# Patient Record
Sex: Female | Born: 1975 | Race: White | Hispanic: No | Marital: Single | Smoking: Current some day smoker
Health system: Southern US, Community
[De-identification: ages and names within clinical notes are randomized; demographics above are authoritative.]

## PROBLEM LIST (undated history)

## (undated) DIAGNOSIS — F319 Bipolar disorder, unspecified: Secondary | ICD-10-CM

## (undated) DIAGNOSIS — F1010 Alcohol abuse, uncomplicated: Secondary | ICD-10-CM

## (undated) DIAGNOSIS — F200 Paranoid schizophrenia: Secondary | ICD-10-CM

---

## 1998-12-05 ENCOUNTER — Emergency Department (HOSPITAL_COMMUNITY): Admission: EM | Admit: 1998-12-05 | Discharge: 1998-12-05 | Payer: Self-pay

## 2001-11-01 ENCOUNTER — Emergency Department (HOSPITAL_COMMUNITY): Admission: EM | Admit: 2001-11-01 | Discharge: 2001-11-01 | Payer: Self-pay

## 2001-12-30 ENCOUNTER — Emergency Department (HOSPITAL_COMMUNITY): Admission: EM | Admit: 2001-12-30 | Discharge: 2001-12-30 | Payer: Self-pay | Admitting: Emergency Medicine

## 2002-04-12 ENCOUNTER — Emergency Department (HOSPITAL_COMMUNITY): Admission: EM | Admit: 2002-04-12 | Discharge: 2002-04-12 | Payer: Self-pay | Admitting: Emergency Medicine

## 2003-01-30 ENCOUNTER — Inpatient Hospital Stay (HOSPITAL_COMMUNITY): Admission: EM | Admit: 2003-01-30 | Discharge: 2003-02-09 | Payer: Self-pay | Admitting: Psychiatry

## 2003-05-20 ENCOUNTER — Emergency Department (HOSPITAL_COMMUNITY): Admission: AD | Admit: 2003-05-20 | Discharge: 2003-05-20 | Payer: Self-pay | Admitting: Emergency Medicine

## 2005-06-25 ENCOUNTER — Emergency Department (HOSPITAL_COMMUNITY): Admission: EM | Admit: 2005-06-25 | Discharge: 2005-06-26 | Payer: Self-pay | Admitting: *Deleted

## 2006-04-10 ENCOUNTER — Emergency Department (HOSPITAL_COMMUNITY): Admission: EM | Admit: 2006-04-10 | Discharge: 2006-04-10 | Payer: Self-pay | Admitting: Emergency Medicine

## 2006-09-21 ENCOUNTER — Emergency Department (HOSPITAL_COMMUNITY): Admission: EM | Admit: 2006-09-21 | Discharge: 2006-09-21 | Payer: Self-pay | Admitting: Emergency Medicine

## 2007-06-25 ENCOUNTER — Ambulatory Visit: Payer: Self-pay | Admitting: Psychiatry

## 2007-06-25 ENCOUNTER — Other Ambulatory Visit: Payer: Self-pay | Admitting: Emergency Medicine

## 2007-06-25 ENCOUNTER — Inpatient Hospital Stay (HOSPITAL_COMMUNITY): Admission: AD | Admit: 2007-06-25 | Discharge: 2007-07-16 | Payer: Self-pay | Admitting: Psychiatry

## 2008-01-02 ENCOUNTER — Emergency Department (HOSPITAL_COMMUNITY): Admission: EM | Admit: 2008-01-02 | Discharge: 2008-01-03 | Payer: Self-pay | Admitting: Emergency Medicine

## 2008-01-03 ENCOUNTER — Emergency Department (HOSPITAL_COMMUNITY): Admission: EM | Admit: 2008-01-03 | Discharge: 2008-01-03 | Payer: Self-pay | Admitting: Emergency Medicine

## 2008-06-20 ENCOUNTER — Ambulatory Visit (HOSPITAL_BASED_OUTPATIENT_CLINIC_OR_DEPARTMENT_OTHER): Admission: RE | Admit: 2008-06-20 | Discharge: 2008-06-20 | Payer: Self-pay

## 2008-06-26 ENCOUNTER — Ambulatory Visit: Payer: Self-pay | Admitting: Internal Medicine

## 2008-12-08 ENCOUNTER — Emergency Department (HOSPITAL_COMMUNITY): Admission: EM | Admit: 2008-12-08 | Discharge: 2008-12-08 | Payer: Self-pay | Admitting: Emergency Medicine

## 2009-03-11 ENCOUNTER — Ambulatory Visit: Payer: Self-pay | Admitting: Family Medicine

## 2009-03-11 ENCOUNTER — Encounter: Payer: Self-pay | Admitting: Family Medicine

## 2009-03-11 DIAGNOSIS — E669 Obesity, unspecified: Secondary | ICD-10-CM | POA: Insufficient documentation

## 2009-03-11 DIAGNOSIS — F17200 Nicotine dependence, unspecified, uncomplicated: Secondary | ICD-10-CM | POA: Insufficient documentation

## 2009-03-11 DIAGNOSIS — G4733 Obstructive sleep apnea (adult) (pediatric): Secondary | ICD-10-CM

## 2009-03-11 DIAGNOSIS — F259 Schizoaffective disorder, unspecified: Secondary | ICD-10-CM | POA: Insufficient documentation

## 2009-03-11 DIAGNOSIS — N912 Amenorrhea, unspecified: Secondary | ICD-10-CM | POA: Insufficient documentation

## 2009-03-11 DIAGNOSIS — F250 Schizoaffective disorder, bipolar type: Secondary | ICD-10-CM | POA: Insufficient documentation

## 2009-03-11 LAB — CONVERTED CEMR LAB
AST: 20 units/L (ref 0–37)
Albumin: 3.8 g/dL (ref 3.5–5.2)
Alkaline Phosphatase: 96 units/L (ref 39–117)
BUN: 9 mg/dL (ref 6–23)
Creatinine, Ser: 0.81 mg/dL (ref 0.40–1.20)
Glucose, Bld: 81 mg/dL (ref 70–99)
HDL: 44 mg/dL (ref >39)
LDL Cholesterol: 159 mg/dL — ABNORMAL HIGH (ref 0–99)
Potassium: 4.4 meq/L (ref 3.5–5.3)
Total Bilirubin: 0.3 mg/dL (ref 0.3–1.2)
Total CHOL/HDL Ratio: 5.3
Triglycerides: 152 mg/dL — ABNORMAL HIGH (ref <150)

## 2009-03-21 ENCOUNTER — Encounter: Payer: Self-pay | Admitting: Family Medicine

## 2009-03-21 ENCOUNTER — Telehealth: Payer: Self-pay | Admitting: Family Medicine

## 2009-03-29 ENCOUNTER — Ambulatory Visit (HOSPITAL_BASED_OUTPATIENT_CLINIC_OR_DEPARTMENT_OTHER): Admission: RE | Admit: 2009-03-29 | Discharge: 2009-03-29 | Payer: Self-pay | Admitting: Family Medicine

## 2009-03-29 ENCOUNTER — Encounter: Payer: Self-pay | Admitting: Family Medicine

## 2009-04-05 ENCOUNTER — Encounter: Payer: Self-pay | Admitting: Family Medicine

## 2009-04-05 ENCOUNTER — Other Ambulatory Visit: Admission: RE | Admit: 2009-04-05 | Discharge: 2009-04-05 | Payer: Self-pay | Admitting: Family Medicine

## 2009-04-05 ENCOUNTER — Ambulatory Visit: Payer: Self-pay | Admitting: Family Medicine

## 2009-04-05 LAB — CONVERTED CEMR LAB
Beta hcg, urine, semiquantitative: NEGATIVE
Pap Smear: NORMAL

## 2009-04-08 ENCOUNTER — Encounter: Payer: Self-pay | Admitting: Family Medicine

## 2009-04-15 ENCOUNTER — Ambulatory Visit: Payer: Self-pay | Admitting: Family Medicine

## 2009-04-15 ENCOUNTER — Encounter: Payer: Self-pay | Admitting: Family Medicine

## 2009-06-03 ENCOUNTER — Emergency Department (HOSPITAL_COMMUNITY): Admission: EM | Admit: 2009-06-03 | Discharge: 2009-06-03 | Payer: Self-pay | Admitting: Family Medicine

## 2009-07-14 ENCOUNTER — Emergency Department (HOSPITAL_COMMUNITY): Admission: EM | Admit: 2009-07-14 | Discharge: 2009-07-14 | Payer: Self-pay | Admitting: Emergency Medicine

## 2009-12-14 ENCOUNTER — Encounter: Payer: Self-pay | Admitting: Family Medicine

## 2009-12-14 ENCOUNTER — Ambulatory Visit: Payer: Self-pay | Admitting: Family Medicine

## 2009-12-14 DIAGNOSIS — F1010 Alcohol abuse, uncomplicated: Secondary | ICD-10-CM | POA: Insufficient documentation

## 2009-12-14 DIAGNOSIS — M159 Polyosteoarthritis, unspecified: Secondary | ICD-10-CM | POA: Insufficient documentation

## 2009-12-15 LAB — CONVERTED CEMR LAB
ALT: 25 units/L (ref 0–35)
AST: 18 units/L (ref 0–37)
Albumin: 4.1 g/dL (ref 3.5–5.2)
Alkaline Phosphatase: 110 units/L (ref 39–117)
BUN: 11 mg/dL (ref 6–23)
Chloride: 101 meq/L (ref 96–112)
Creatinine, Ser: 0.79 mg/dL (ref 0.40–1.20)
Hemoglobin: 16.3 g/dL — ABNORMAL HIGH (ref 12.0–15.0)
MCHC: 32.6 g/dL (ref 30.0–36.0)
Platelets: 251 10*3/uL (ref 150–400)
Potassium: 4.6 meq/L (ref 3.5–5.3)
RDW: 14.6 % (ref 11.5–15.5)

## 2010-03-01 ENCOUNTER — Ambulatory Visit: Payer: Self-pay | Admitting: Family Medicine

## 2010-03-09 ENCOUNTER — Telehealth: Payer: Self-pay | Admitting: Pulmonary Disease

## 2010-05-24 ENCOUNTER — Ambulatory Visit: Payer: Self-pay | Admitting: Family Medicine

## 2010-07-26 ENCOUNTER — Emergency Department (HOSPITAL_COMMUNITY): Admission: EM | Admit: 2010-07-26 | Discharge: 2010-07-26 | Payer: Self-pay | Admitting: Family Medicine

## 2010-09-06 ENCOUNTER — Ambulatory Visit (INDEPENDENT_AMBULATORY_CARE_PROVIDER_SITE_OTHER): Payer: Self-pay
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
## 2010-11-02 NOTE — Progress Notes (Signed)
Summary: nos appt  Phone Note Call from Patient   Caller: [email or web address]  Call For: Tonya Gill Summary of Call: LMTCB x2 to rsc nos from 6/8. Initial call taken by: Darletta Moll,  March 09, 2010 2:59 PM

## 2010-11-02 NOTE — Assessment & Plan Note (Signed)
Summary: depo,df  Nurse Visit   Allergies: No Known Drug Allergies  Medication Administration  Injection # 1:    Medication: Depo-Provera 150mg     Diagnosis: CONTRACEPTIVE MANAGEMENT (ICD-V25.09)    Route: IM    Site: RUOQ gluteus    Exp Date: 11/2012    Lot #: [id number]    Mfr: [email or web address]    Comments: next depo due Nov 9 thru Aug 23, 2010    Patient tolerated injection without complications    Given by: Theresia Lo RN (May 24, 2010 1:44 PM)  Orders Added: 1)  Depo-Provera 150mg  [J1055] 2)  Admin of Injection (IM/SQ) [04540]   Medication Administration  Injection # 1:    Medication: Depo-Provera 150mg     Diagnosis: CONTRACEPTIVE MANAGEMENT (ICD-V25.09)    Route: IM    Site: RUOQ gluteus    Exp Date: 11/2012    Lot #: [id number]    Mfr: greenstone    Comments: next depo due Nov 9 thru Aug 23, 2010    Patient tolerated injection without complications    Given by: Theresia Lo RN (May 24, 2010 1:44 PM)  Orders Added: 1)  Depo-Provera 150mg  [J1055] 2)  Admin of Injection (IM/SQ) [47829]

## 2010-11-02 NOTE — Assessment & Plan Note (Signed)
Summary: depo,df  Nurse Visit   Allergies: No Known Drug Allergies Laboratory Results   Urine Tests  Date/Time Received: September 06, 2010 1:47 PM Date/Time Reported: September 06, 2010 1:59 PM     Urine HCG: negative Comments: ...............test performed by......Marland KitchenBonnie A. [place], MLS (ASCP)cm     Medication Administration  Injection # 1:    Medication: Depo-Provera 150mg     Diagnosis: CONTRACEPTIVE MANAGEMENT (ICD-V25.09)    Route: IM    Site: RUOQ gluteus    Exp Date: 01/2013    Lot #: [id number]    Mfr: greenstone    Comments:  next depo due Feb 22 thru December 07, 2010    Patient tolerated injection without complications    Given by: Theresia Lo RN (September 06, 2010 2:25 PM)  Orders Added: 1)  U Preg-[facility] [81025] 2)  Depo-Provera 150mg  [J1055] 3)  Admin of Injection (IM/SQ) [04540]    Medication Administration  Injection # 1:    Medication: Depo-Provera 150mg     Diagnosis: CONTRACEPTIVE MANAGEMENT (ICD-V25.09)    Route: IM    Site: RUOQ gluteus    Exp Date: 01/2013    Lot #: [id number]    Mfr: greenstone    Comments:  next depo due Feb 22 thru December 07, 2010    Patient tolerated injection without complications    Given by: Theresia Lo RN (September 06, 2010 2:25 PM)  Orders Added: 1)  U Preg-[facility] [81025] 2)  Depo-Provera 150mg  [J1055] 3)  Admin of Injection (IM/SQ) [47829]

## 2010-11-02 NOTE — Assessment & Plan Note (Signed)
Summary: depo,df  Nurse Visit   Allergies: No Known Drug Allergies  Medication Administration  Injection # 1:    Medication: Depo-Provera 150mg     Diagnosis: CONTRACEPTIVE MANAGEMENT (ICD-V25.09)    Route: IM    Site: LUOQ gluteus    Exp Date: 12/2011    Lot #: [id number]    Mfr: Pharmacia    Comments: next depo due August 17 thru May 31, 2010    Patient tolerated injection without complications    Given by: Theresia Lo RN (March 01, 2010 1:52 PM)  Orders Added: 1)  Depo-Provera 150mg  [J1055] 2)  Admin of Injection (IM/SQ) [04540]   Medication Administration  Injection # 1:    Medication: Depo-Provera 150mg     Diagnosis: CONTRACEPTIVE MANAGEMENT (ICD-V25.09)    Route: IM    Site: LUOQ gluteus    Exp Date: 12/2011    Lot #: [id number]    Mfr: Pharmacia    Comments: next depo due August 17 thru May 31, 2010    Patient tolerated injection without complications    Given by: Theresia Lo RN (March 01, 2010 1:52 PM)  Orders Added: 1)  Depo-Provera 150mg  [J1055] 2)  Admin of Injection (IM/SQ) [47829]

## 2010-11-14 ENCOUNTER — Encounter: Payer: Self-pay | Admitting: Family Medicine

## 2010-11-14 ENCOUNTER — Ambulatory Visit (INDEPENDENT_AMBULATORY_CARE_PROVIDER_SITE_OTHER): Payer: Medicaid Other | Admitting: Family Medicine

## 2010-11-14 DIAGNOSIS — E782 Mixed hyperlipidemia: Secondary | ICD-10-CM

## 2010-11-14 DIAGNOSIS — E669 Obesity, unspecified: Secondary | ICD-10-CM

## 2010-11-14 DIAGNOSIS — G4733 Obstructive sleep apnea (adult) (pediatric): Secondary | ICD-10-CM

## 2010-11-14 DIAGNOSIS — H0260 Xanthelasma of unspecified eye, unspecified eyelid: Secondary | ICD-10-CM

## 2010-11-14 NOTE — Assessment & Plan Note (Signed)
Refer to derm.  Pt would like these removed.

## 2010-11-14 NOTE — Assessment & Plan Note (Signed)
Stable.  Pt interested in weight loss.  rec diet and exercise changes.  Check LDL today.

## 2010-11-14 NOTE — Patient Instructions (Addendum)
Please go to the lab for a cholesterol check I will send you a letter with the results I have put in an order for your CPAP and for your dermatology referral Stop drinking any beer or soda Add frozen or low sodium vegetables to your diet Eat three times a day Come back in 2-3 months to see how you are doing

## 2010-11-14 NOTE — Progress Notes (Signed)
  Subjective:    Patient ID: Tonya Gill, female    DOB: 14-Jul-1976, 35 y.o.   MRN: [id number]  HPI  Sleep apnea-  Got a sleep study 2 years ago but need got CPAP.  Wants to get one now.    Obesity- only eats 1 meal a day.  Only has 100 dollars to eat/month.  Eats ramen/ bologna/bread/cereal.  Limited vegetables.  Is homeless.  Rents room from relative.  Review of Systems     Objective:   Physical Exam  Constitutional: She appears well-developed and well-nourished.  Eyes:    Cardiovascular: Normal rate and regular rhythm.   Pulmonary/Chest: Effort normal and breath sounds normal.          Assessment & Plan:

## 2010-12-29 NOTE — Progress Notes (Signed)
  Subjective:    Patient ID: Tonya Gill, female    DOB: [birth year], 35 y.o.   MRN: [id number]  HPI reinput orders   Review of Systems     Objective:   Physical Exam        Assessment & Plan:

## 2011-01-01 ENCOUNTER — Encounter: Payer: Self-pay | Admitting: *Deleted

## 2011-01-03 ENCOUNTER — Other Ambulatory Visit: Payer: Self-pay | Admitting: Family Medicine

## 2011-01-03 DIAGNOSIS — G4733 Obstructive sleep apnea (adult) (pediatric): Secondary | ICD-10-CM

## 2011-01-11 LAB — POCT I-STAT, CHEM 8
BUN: 3 mg/dL — ABNORMAL LOW (ref 6–23)
Calcium, Ion: 1.09 mmol/L — ABNORMAL LOW (ref 1.12–1.32)
Chloride: 98 mEq/L (ref 96–112)
Creatinine, Ser: 0.9 mg/dL (ref 0.4–1.2)
Glucose, Bld: 116 mg/dL — ABNORMAL HIGH (ref 70–99)

## 2011-01-11 LAB — URINALYSIS, ROUTINE W REFLEX MICROSCOPIC
Protein, ur: NEGATIVE mg/dL
Specific Gravity, Urine: 1.001 — ABNORMAL LOW (ref 1.005–1.030)
Urobilinogen, UA: 0.2 mg/dL (ref 0.0–1.0)

## 2011-01-11 LAB — GLUCOSE, CAPILLARY: Glucose-Capillary: 131 mg/dL — ABNORMAL HIGH (ref 70–99)

## 2011-01-11 LAB — POCT PREGNANCY, URINE: Preg Test, Ur: NEGATIVE

## 2011-02-12 ENCOUNTER — Ambulatory Visit (HOSPITAL_BASED_OUTPATIENT_CLINIC_OR_DEPARTMENT_OTHER): Payer: Medicare Other | Attending: Family Medicine

## 2011-02-12 DIAGNOSIS — G4733 Obstructive sleep apnea (adult) (pediatric): Secondary | ICD-10-CM | POA: Insufficient documentation

## 2011-02-13 NOTE — Procedures (Signed)
NAME:  Tonya Gill, Tonya Gill               ACCOUNT NO.:  [id number]   MEDICAL RECORD NO.:  [id number]          PATIENT TYPE:  OUT   LOCATION:  SLEEP CENTER                 FACILITY:  [facility]   PHYSICIAN:  Clinton D. Maple Hudson, MD, FCCP, FACPDATE OF BIRTH:  1974-01-[birth year]   DATE OF STUDY:  03/29/2009                            NOCTURNAL POLYSOMNOGRAM   REFERRING PHYSICIAN:   REFERRING PHYSICIAN:  Romero Belling, MD   INDICATIONS FOR STUDY:  Hypersomnia with sleep apnea.   EPWORTH SLEEPINESS SCORE:  Epworth sleepiness score 12/24.  BMI 41.3.  Weight 256 pounds.  Height 66 inches.  Neck 17 inches.   HOME MEDICATIONS:  Charted and reviewed.   A baseline diagnostic NPSG on June 20, 2008 recorded an AHI of 12.8  per hour.  CPAP titration is requested.   SLEEP ARCHITECTURE:  Total sleep time 377 minutes with sleep efficiency  80.4%.  Stage I was 8.5%, stage II 56.3%, stage III 20%, REM 15.2% of  total sleep time.  Sleep latency 51 minutes, REM latency 89 minutes,  awake after sleep onset 44 minutes, arousal index 32.38.   BEDTIME MEDICATION:  Lorazepam, benztropine, and fluphenazine.   RESPIRATORY DATA:  CPAP titration protocol.  CPAP was titrated to 16  CWP, AHI 0 per hour.  She wore a medium ResMed Quattro full-face mask  with heated humidifier.   OXYGEN DATA:  Snoring was largely prevented by CPAP and mean oxygen  saturation with CPAP and room air was 91.9%.   CARDIAC DATA:  Normal sinus rhythm.   MOVEMENT/PARASOMNIA:  No significant movement disturbance.  Bathroom x1.   IMPRESSION/RECOMMENDATIONS:  1. Successful continuous positive airway pressure titration to 16 CWP,      AHI 0 per hour.  She wore a medium      ResMed Quattro full-face mask with heated humidifier.  2. Baseline diagnostic nocturnal polysomnogram on June 20, 2008,      recorded an AHI of 12.8 per hour.      Clinton D. Maple Hudson, MD, [facility], FACP  Diplomate, Biomedical engineer of Sleep Medicine  Electronically  Signed     CDY/MEDQ  D:  04/16/2009 15:03:57  T:  04/16/2009 23:40:16  Job:  [id number]
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
## 2011-02-13 NOTE — Procedures (Signed)
NAME:  Tonya Gill, Tonya Gill NO.:  [id number]   MEDICAL RECORD NO.:  [id number]          PATIENT TYPE:  OUT   LOCATION:  SLEEP CENTER                 FACILITY:  [facility]   PHYSICIAN:  Clinton D. Maple Hudson, MD, FCCP, FACPDATE OF BIRTH:  04/16/74   DATE OF STUDY:  06/20/2008                            NOCTURNAL POLYSOMNOGRAM   REFERRING PHYSICIAN:   REFERRING PHYSICIAN:  Dr. Holli Humbles.   INDICATION FOR STUDY:  Hypersomnia with sleep apnea.   EPWORTH SLEEPINESS SCORE:  11/25, BMI 41.3, weight 256 pounds, height 66  inches, neck 14 inches.   HOME MEDICATIONS:  Charted and reviewed.   SLEEP ARCHITECTURE:  Total sleep time 364 minutes with sleep efficiency  80.8%.  Stage 1 was 5.2%, stage 2 was 76.3%, stage 3 absent, REM 18.5%  of total sleep time.  Sleep latency 30 minutes, REM latency 243 minutes,  awake after sleep onset 55 minutes, arousal index 4.4.  Home medications  were taken at 7 p.m. before arrival at Sleep Center.   RESPIRATORY DATA:  Diagnostic NPSG protocol as ordered.  Apnea/hypopnea  index (AHI) 12.8 per hour.  A total of 78 events were scored including 3  obstructive apneas and 75 hypopneas.  Some events were recorded while  non-supine, but most events were while sleeping supine.  REM AHI 23.1  per hour.  CPAP titration was not ordered.   OXYGEN DATA:  Moderate snoring with oxygen desaturation to a nadir of  84%.  Mean oxygen saturation through the study was 91.5% on room air.  A  total of 2.9 minutes was recorded with oxygen saturation less than 88%  on room air.   CARDIAC DATA:  Normal sinus rhythm.   MOVEMENT/PARASOMNIA:  No significant movement disturbance.  Bathroom x1.   IMPRESSION/RECOMMENDATION:  1. Sleep architecture was not remarkable for Sleep Center environment.  2. Mild obstructive sleep apnea/hypopnea syndrome, apnea/hypopnea      index 12.8 per hour.  Events were more common while supine, but      noted in all sleep positions.   Moderate snoring with oxygen      desaturation to a nadir of  84%.  3. Scores in this range may respond to conservative therapy including      weight loss, treatment for upper airway congestion or obstruction,      and encouragement to sleep off flat of back.  Continuous positive      airway pressure      (CPAP) would also be a therapeutic option.  If appropriate,      consider return for continuous positive airway pressure titration      study or alternative management as appropriate.      Clinton D. Maple Hudson, MD, [facility], FACP  Diplomate, Biomedical engineer of Sleep Medicine  Electronically Signed     CDY/MEDQ  D:  06/26/2008 11:23:54  T:  06/27/2008 16:51:05  Job:  [id number]

## 2011-02-13 NOTE — Discharge Summary (Signed)
NAMEKASHONDA, SARKISYAN NO.:  [id number]   MEDICAL RECORD NO.:  [id number]          PATIENT TYPE:  IPS   LOCATION:  0401                          [facility]ACILITY:  [facility]   PHYSICIAN:  Anselm Jungling, MD  DATE OF BIRTH:  1976-04-13   DATE OF ADMISSION:  06/25/2007  DATE OF DISCHARGE:                               DISCHARGE SUMMARY   ADDENDUM TO DISCHARGE SUMMARY   Please refer to the discharge summary that I dictate on Ms. Kenan on  07/07/2007.   In summary, the patient was admitted to our facility on 06/25/2007.  On  07/07/2007,  it was felt that the patient would be transferred to [facility] for longer-term inpatient psychiatric care.  At that  time, the previous discharge summary was dictated in preparation for  that transfer.  Unfortunately, due to unavailability of space at [facility] at that time, the patient could not be transferred.  Over the following 2 days, the patient showed what appeared to be the  beginnings of an improving trend, with markedly less irritability, more  ability to engage in some degree of treatment and discharge planning.  A  plan was enacted to explore the possibility of the patient going to the  [facility] located in [place], a 90 day program  geared towards dual diagnosis treatment.   We began to pursue the possibility of the patient going to [facility],  but in the meantime, she appeared to lose the gains previously mentioned  that appeared to signal an improving trend.  She had been on a regimen  of Risperdal 2 mg q.h.s.  At this time, Haldol 10 mg q.h.s. was added to  her regimen.   Since then, it has become apparent that the patient is not really  capable of participating in further treatment planning and discharge  planning with [place].  She has been unable to complete and fulfill various  tasks that would pertain to her going to [facility], including  participating in the telephone  interview with their intake and  assessment department.  The patient has shown inability and  unwillingness to do so.   In addition, the patient has begun to refuse doses of anti-psychotic  medication intermittently.   All of the above being the case, we have determined that the patient  will not be appropriate for a program such as [facility], and will  indeed need to go to a longterm psychiatric facility such as the state  operated facility in [place], [place].  In preparation for this,  this current document is being dictated in preparation for her transfer  there.   At present, her medication regimen consists of Risperdal 2 mg q.h.s.,  Haldol 10 mg q.h.s., and multivitamin 1 tablet daily, and thiamine 100  mg daily.  She also has p.r.n. medications ordered including Tylenol,  Maalox, Milk of Magnesia, Dulcolax, Cepacol.   The undersigned and case manager, [employer], will continue to work with  the patient and the intake department at [facility] regarding  the patient's transfer there as soon as  it can be effected.   It should be noted that [facility] assessment department has requested  additional laboratory, specifically a CBC.  This was requested because  of a slightly elevated WBC count in the patient's initial lab work.  The  CBC was  repeated on approximately 07/10/2007, and WBCs were 9,300, and the CBC  profile was otherwise within normal limits.  There have been no medical  problems or new medical issues since the initial discharge summary was  dictated on 07/07/2007.      Anselm Jungling, MD  Electronically Signed     SPB/MEDQ  D:  07/15/2007  T:  07/15/2007  Job:  [id number]
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
## 2011-02-16 NOTE — Discharge Summary (Signed)
NAME:  Tonya Gill, Tonya Gill                         ACCOUNT NO.:  [id number]   MEDICAL RECORD NO.:  [id number]                   PATIENT TYPE:  IPS   LOCATION:  0401                                 FACILITY:  [facility]   PHYSICIAN:  Geoffery Lyons, M.D.                   DATE OF BIRTH:  14-Feb-1976   DATE OF ADMISSION:  01/30/2003  DATE OF DISCHARGE:  02/09/2003                                 DISCHARGE SUMMARY   CHIEF COMPLAINT AND PRESENT ILLNESS:  This was a 35 year old female former  PACT Team patient diagnosed with schizoaffective disorder versus  schizophrenia.  She was being treated with medication or a therapist for the  last one year.  She had been becoming more depressed, staying in bed up to  16 hours per day, and abusing alcohol, some issues with marijuana and  cocaine but stepfather did not think that she had been using it.  Upon  admission, she was irritable.  She claimed she was not real, she was just a  voice, You cannot take my blood pressure because I have no heart; refused  treatment pertaining to the admission process.   PAST PSYCHIATRIC HISTORY:  She has been inpatient and outpatient, although  she is very guarded about the information.   SUBSTANCE ABUSE HISTORY:  She had been using alcohol to some degree of late.  She has a past history of marijuana and cocaine abuse.   PHYSICAL EXAMINATION:  Physical examination reveals was performed, failed to  show any acute findings.   MENTAL STATUS EXAM:  Mental status exam revealed a casually dressed female,  some psychomotor retardation, not answering any questions, somewhat  lethargic, some evidence of some delusional ideas.  There were some  hallucinations.  Cognitive: Cognition seemed to be well preserved.   ADMISSION DIAGNOSES:   AXIS I:  1. Schizoaffective disorder.  2. Alcohol abuse.   AXIS II:  No diagnosis.   AXIS III:  Obesity.   AXIS IV:  Moderate.   AXIS V:  Global assessment of functioning upon admission  20-25, highest  global assessment of functioning in the last year 55-60.   HOSPITAL COURSE:  She was admitted and started in intensive individual and  group psychotherapy.  She was started on Seroquel, which was discontinued,  and she was switched to Geodon 80 mg twice a day.  She was started on Paxil  that was increased up to 25 mg per day.  She was initially very reserved,  very guarded, evidencing acute delusional ideas, although denied  hallucinations.  She would not admit to anything being wrong with her mind.  She denied symptoms.  She claimed that she took her medications.  Mood was  anxious, affect was constricted.  There were positive delusional ideas as  well as hallucinations.  As we increased the Geodon, she seemed to start  feeling better, was more focused, rational.  There  was less response to  delusional ideas.  She did experience evidence of fluctuations.  After  seemingly improved she then showed to be somewhat guarded, reserved,  changing the story a couple of times of what her accommodations were in  terms of where she was going to go.  We were not able in touch with the  stepfather or the roommate.  She was wanting to be discharged and we made  several attempts to get the plan going.  It felt that she was basically  baseline but the main concern was discharge as she was wanting to be  discharged and the arrangements were slow coming.  On May 11, she was in  full contact with reality, mood was euthymic, affect was brighter.  She knew  that we had contacted the stepfather and that he was going to pick her up.  The roommate-stepfather- was content with her discharge as long as she would  stay on her medications and she agreed to do that.   DISCHARGE DIAGNOSES:   AXIS I:  Schizoaffective disorder.   AXIS II:  No diagnosis.   AXIS III:  No diagnosis.   AXIS IV:  Moderate.   AXIS V:  Global assessment of functioning upon discharge 50.   DISCHARGE MEDICATIONS:  1.  Geodon 80 mg twice a day.  2. Paxil CR 25 mg per day.   FOLLOW UP:  She was to follow up at [facility].                                               Geoffery Lyons, M.D.    IL/MEDQ  D:  03/10/2003  T:  03/11/2003  Job:  [id number]

## 2011-02-17 DIAGNOSIS — G4733 Obstructive sleep apnea (adult) (pediatric): Secondary | ICD-10-CM

## 2011-02-17 NOTE — Procedures (Signed)
NAME:  Tonya Gill, Tonya Gill NO.:  [id number]  MEDICAL RECORD NO.:  [id number]          PATIENT TYPE:  OUT  LOCATION:  SLEEP CENTER                 FACILITY:  [facility]  PHYSICIAN:  Clinton D. Maple Hudson, MD, FCCP, FACPDATE OF BIRTH:  02/23/1974  DATE OF STUDY:  02/12/2011                           NOCTURNAL POLYSOMNOGRAM  REFERRING PHYSICIAN:  RACHEL SPIEGEL  INDICATION FOR STUDY:  Hypersomnia with sleep apnea.  EPWORTH SLEEPINESS SCORE:  16/24.  BMI 45.2.  Weight 280 pounds, height 66 inches.  Neck 17 inches.  MEDICATIONS:  Home medications were charted and reviewed..  A baseline diagnostic NPSG on June 20, 2008, had recorded an AHI of 12.8 per hour.  CPAP titration on March 29, 2009, titrated to the pressure of 16 cwp for an AHI of 0 per hour.  Weight was 256 pounds.  SLEEP ARCHITECTURE:  Split study protocol.  During the diagnostic phase, total sleep time 124 minutes with sleep efficiency 62.6%.  Stage I was 23.4%, stage II 74.6%, stage III 2%, REM absent.  Sleep latency 14.5 minutes, awake after sleep onset 56.5 minutes, arousal index 37.7. Bedtime medication:  Ativan, Cogentin, Prolixin, and Vistaril.  RESPIRATORY DATA:  Split study protocol.  Apnea/hypopnea index (AHI) 26.2 per hour.  A total of 53 events was scored including 6 obstructive apneas, 1 central apnea, 46 hypopneas.  Events were associated with non supine sleep.  CPAP was then titrated to 14 mL cwp, AHI 2.1 per hour. She wore a medium ResMed Mirage Quattro full-face mask with heated humidifier and C-flex setting of 3.  OXYGEN DATA:  Before CPAP control snoring was extremely loud with oxygen desaturation to a nadir of 85% on room air.  With CPAP titration, mean oxygen saturation held 91.8% on room air and snoring was prevented.  CARDIAC DATA:  Normal sinus rhythm.  MOVEMENT/PARASOMNIA:  No significant movement disturbance.  Bathroom times one.  The patient was unable to sleep supine because of a  back injury.  IMPRESSION/RECOMMENDATIONS: 1. Moderate obstructive sleep apnea/ hypopnea syndrome, AHI 26.2 per     hour with non supine sleep (could not sleep flat on back because of     back injury), very loud snoring, and oxygen desaturation to a nadir     of 85% on room air. 2. Successful CPAP titration to 14 cwp, AHI 2.1 per hour.  She wore a     medium ResMed Mirage Quattro full-face mask with heated humidifier     and C-flex setting of 3. 3. Weight for the present study night was recorded at 280 pounds.  For     a baseline diagnostic NPSG on     June 20, 2008, when AHI was 12.8 per hour, she was recorded weighing 256 pounds.     Clinton D. Maple Hudson, MD, [facility], FACP Diplomate, Biomedical engineer of Sleep Medicine Electronically Signed    CDY/MEDQ  D:  02/17/2011 09:51:48  T:  02/17/2011 21:17:50  Job:  [id number]

## 2011-02-19 ENCOUNTER — Ambulatory Visit (INDEPENDENT_AMBULATORY_CARE_PROVIDER_SITE_OTHER): Payer: Medicaid Other | Admitting: *Deleted

## 2011-02-19 ENCOUNTER — Other Ambulatory Visit: Payer: Self-pay | Admitting: Family Medicine

## 2011-02-19 DIAGNOSIS — IMO0001 Reserved for inherently not codable concepts without codable children: Secondary | ICD-10-CM

## 2011-02-19 DIAGNOSIS — Z309 Encounter for contraceptive management, unspecified: Secondary | ICD-10-CM

## 2011-02-19 MED ORDER — MEDROXYPROGESTERONE ACETATE 150 MG/ML IM SUSP
150.0000 mg | Freq: Once | INTRAMUSCULAR | Status: AC
  Administered 2011-02-19: 150 mg via INTRAMUSCULAR

## 2011-02-19 MED ORDER — MEDROXYPROGESTERONE ACETATE 104 MG/0.65ML ~~LOC~~ SUSP: 150.0000 mg | SUBCUTANEOUS | Status: DC

## 2011-02-19 NOTE — Progress Notes (Signed)
Patient reports she has not had sex for 2 months. Depo  Provera given today.

## 2011-03-09 ENCOUNTER — Other Ambulatory Visit: Payer: Self-pay | Admitting: Family Medicine

## 2011-03-09 ENCOUNTER — Encounter: Payer: Self-pay | Admitting: Family Medicine

## 2011-03-09 DIAGNOSIS — G4733 Obstructive sleep apnea (adult) (pediatric): Secondary | ICD-10-CM

## 2011-04-05 ENCOUNTER — Ambulatory Visit: Payer: Medicaid Other | Admitting: Family Medicine

## 2011-04-19 ENCOUNTER — Ambulatory Visit: Payer: Medicaid Other | Admitting: Family Medicine

## 2011-05-04 ENCOUNTER — Ambulatory Visit (INDEPENDENT_AMBULATORY_CARE_PROVIDER_SITE_OTHER): Payer: Medicaid Other | Admitting: Family Medicine

## 2011-05-04 ENCOUNTER — Encounter: Payer: Self-pay | Admitting: Family Medicine

## 2011-05-04 DIAGNOSIS — J45909 Unspecified asthma, uncomplicated: Secondary | ICD-10-CM

## 2011-05-04 DIAGNOSIS — M159 Polyosteoarthritis, unspecified: Secondary | ICD-10-CM

## 2011-05-04 DIAGNOSIS — F17200 Nicotine dependence, unspecified, uncomplicated: Secondary | ICD-10-CM

## 2011-05-04 DIAGNOSIS — G4733 Obstructive sleep apnea (adult) (pediatric): Secondary | ICD-10-CM

## 2011-05-04 DIAGNOSIS — J4520 Mild intermittent asthma, uncomplicated: Secondary | ICD-10-CM

## 2011-05-04 DIAGNOSIS — IMO0002 Reserved for concepts with insufficient information to code with codable children: Secondary | ICD-10-CM | POA: Insufficient documentation

## 2011-05-04 MED ORDER — AEROCHAMBER PLUS MISC: Status: AC

## 2011-05-04 MED ORDER — ALBUTEROL SULFATE HFA 108 (90 BASE) MCG/ACT IN AERS: 2.0000 | INHALATION_SPRAY | Freq: Four times a day (QID) | RESPIRATORY_TRACT | Status: DC | PRN

## 2011-05-04 NOTE — Assessment & Plan Note (Signed)
Would like to quit, strong addiction, occasional smoking at night and <15 min before first cigarette.  Will send to dr. Raymondo Band for help with tx

## 2011-05-04 NOTE — Assessment & Plan Note (Signed)
Improved, wearing CPAP, continue

## 2011-05-04 NOTE — Progress Notes (Signed)
  Subjective:    Patient ID: Tonya Gill, female    DOB: [birth year], 35 y.o.   MRN: [id number]  HPI  Asthma- pt reports asthma since childhood taht she treats with primatine mist.  Never had exacerbation treated with albuterol.  Can walk long distances.  Still smoking 1/2-1 ppd  Tobacco- pt wants to quit.  Smokes 1/2-1ppd, sometimes will smoke at night.  Also <15 min until first cigarette in AM.  Has not tried anything yet  OSA-- now using CPAP, feels that she sleeps better and wakes up less at night.    Shoulder pain-  Pt sleeps on her stomach with her arms under her.  She wakes up with her arms numb occasionally.  For 3 weeks has had a dull ache in left shoulder.  No loss of ROM, no weakness, just ache Review of Systems    Denies CP, SOB, HA, N/V/D, fever  Objective:   Physical Exam  Vital signs reviewed General appearance - alert, well appearing, and in no distress and oriented to person, place, and time Heart - normal rate, regular rhythm, normal S1, S2, no murmurs, rubs, clicks or gallops Chest - clear to auscultation, no wheezes, rales or rhonchi, symmetric air entry, no tachypnea, retractions or cyanosis Extremities - peripheral pulses normal, no pedal edema, no clubbing or cyanosis Left shoulder with full ROM, negative yergesons, no bicep tenderness, full strength, normal sensation, good pulses      Assessment & Plan:  SLEEP APNEA, OBSTRUCTIVE, MILD Improved, wearing CPAP, continue  TOBACCO ABUSE Would like to quit, strong addiction, occasional smoking at night and <15 min before first cigarette.  Will send to dr. Raymondo Band for help with tx  Asthma, intermittent Pt report of asthma that she has been tx with primatine mist.  Not previously evaluated.  Will get albuterol and spacer today and have pt evaluated by Dr. Raymondo Band.  OSTEOARTHRITIS, MULTIPLE JOINTS Joint pain aggravated by sleeping position.  Advised try sleeping with arms at sides

## 2011-05-04 NOTE — Assessment & Plan Note (Signed)
Pt report of asthma that she has been tx with primatine mist.  Not previously evaluated.  Will get albuterol and spacer today and have pt evaluated by Dr. Raymondo Band.

## 2011-05-04 NOTE — Patient Instructions (Signed)
It was nice to meet you today  Please make an appt to see Dr. Raymondo Band for PFTs (lung tests) and smoking cessation  I have sent in albuterol to use when you feel short of breath

## 2011-05-04 NOTE — Assessment & Plan Note (Signed)
Joint pain aggravated by sleeping position.  Advised try sleeping with arms at sides

## 2011-05-18 ENCOUNTER — Ambulatory Visit (INDEPENDENT_AMBULATORY_CARE_PROVIDER_SITE_OTHER): Payer: Medicaid Other | Admitting: *Deleted

## 2011-05-18 DIAGNOSIS — Z309 Encounter for contraceptive management, unspecified: Secondary | ICD-10-CM

## 2011-05-18 MED ORDER — MEDROXYPROGESTERONE ACETATE 150 MG/ML IM SUSP
150.0000 mg | Freq: Once | INTRAMUSCULAR | Status: AC
  Administered 2011-05-18: 150 mg via INTRAMUSCULAR

## 2011-05-25 ENCOUNTER — Ambulatory Visit: Payer: Medicaid Other | Admitting: Pharmacist

## 2011-06-26 LAB — BASIC METABOLIC PANEL
CO2: 27
Chloride: 102
GFR calc Af Amer: >60
Potassium: 3.7
Sodium: 139

## 2011-06-26 LAB — CBC
HCT: 51.3 — ABNORMAL HIGH
Hemoglobin: 17.7 — ABNORMAL HIGH
MCHC: 34.6
MCHC: 35.1
MCV: 90.8
MCV: 91.6
RBC: 5.59 — ABNORMAL HIGH
RDW: 13.2

## 2011-06-26 LAB — RAPID URINE DRUG SCREEN, HOSP PERFORMED
Barbiturates: NOT DETECTED
Benzodiazepines: NOT DETECTED
Cocaine: NOT DETECTED
Opiates: NOT DETECTED

## 2011-06-26 LAB — URINALYSIS, ROUTINE W REFLEX MICROSCOPIC
Glucose, UA: NEGATIVE
Protein, ur: NEGATIVE
Specific Gravity, Urine: 1.008
pH: 7

## 2011-06-26 LAB — DIFFERENTIAL
Basophils Absolute: 0.1
Basophils Relative: 1
Basophils Relative: 1
Eosinophils Absolute: 0.3
Eosinophils Absolute: 0.4
Lymphs Abs: 5.2 — ABNORMAL HIGH
Monocytes Absolute: 0.7
Monocytes Absolute: 0.8
Monocytes Relative: 4
Neutro Abs: 8.7 — ABNORMAL HIGH
Neutrophils Relative %: 64

## 2011-07-12 LAB — URINALYSIS, ROUTINE W REFLEX MICROSCOPIC
Glucose, UA: NEGATIVE
Hgb urine dipstick: NEGATIVE
Specific Gravity, Urine: 1.019
Urobilinogen, UA: 0.2
pH: 6.5

## 2011-07-12 LAB — CBC
HCT: 50.7 — ABNORMAL HIGH
Hemoglobin: 15.4 — ABNORMAL HIGH
Hemoglobin: 17.4 — ABNORMAL HIGH
MCHC: 34.4
MCV: 93.8
Platelets: 238
RBC: 5.4 — ABNORMAL HIGH
RDW: 13.3
WBC: 11.4 — ABNORMAL HIGH

## 2011-07-12 LAB — COMPREHENSIVE METABOLIC PANEL
Albumin: 3.4 — ABNORMAL LOW
Alkaline Phosphatase: 111
BUN: 5 — ABNORMAL LOW
CO2: 30
Chloride: 98
Creatinine, Ser: 1.05
GFR calc non Af Amer: >60
Potassium: 4.4
Total Bilirubin: 0.8

## 2011-07-12 LAB — RAPID URINE DRUG SCREEN, HOSP PERFORMED
Barbiturates: NOT DETECTED
Benzodiazepines: NOT DETECTED
Cocaine: NOT DETECTED
Opiates: NOT DETECTED

## 2011-07-12 LAB — DIFFERENTIAL
Basophils Absolute: 0.1
Basophils Absolute: 0.1
Basophils Relative: 2 — ABNORMAL HIGH
Basophils Relative: 1
Eosinophils Relative: 1
Lymphocytes Relative: 36
Monocytes Absolute: 0.6
Monocytes Absolute: 0.5
Neutro Abs: 4.8
Neutro Abs: 7.8 — ABNORMAL HIGH
Neutrophils Relative %: 53

## 2011-07-12 LAB — POCT PREGNANCY, URINE: Preg Test, Ur: NEGATIVE

## 2011-07-12 LAB — ETHANOL: Alcohol, Ethyl (B): <5

## 2011-09-11 ENCOUNTER — Encounter: Payer: Self-pay | Admitting: Family Medicine

## 2011-09-11 ENCOUNTER — Ambulatory Visit (INDEPENDENT_AMBULATORY_CARE_PROVIDER_SITE_OTHER): Payer: Medicare Other | Admitting: Family Medicine

## 2011-09-11 VITALS — BP 144/98 | HR 88 | Temp 98.3°F | Ht 67.0 in | Wt 288.0 lb

## 2011-09-11 DIAGNOSIS — J4520 Mild intermittent asthma, uncomplicated: Secondary | ICD-10-CM

## 2011-09-11 DIAGNOSIS — Z23 Encounter for immunization: Secondary | ICD-10-CM

## 2011-09-11 DIAGNOSIS — J45909 Unspecified asthma, uncomplicated: Secondary | ICD-10-CM

## 2011-09-11 DIAGNOSIS — G4733 Obstructive sleep apnea (adult) (pediatric): Secondary | ICD-10-CM

## 2011-09-11 DIAGNOSIS — M159 Polyosteoarthritis, unspecified: Secondary | ICD-10-CM

## 2011-09-11 MED ORDER — ALBUTEROL SULFATE HFA 108 (90 BASE) MCG/ACT IN AERS: 2.0000 | INHALATION_SPRAY | Freq: Four times a day (QID) | RESPIRATORY_TRACT | Status: DC | PRN

## 2011-09-11 MED ORDER — BECLOMETHASONE DIPROPIONATE 80 MCG/ACT IN AERS: 2.0000 | INHALATION_SPRAY | RESPIRATORY_TRACT | Status: DC | PRN

## 2011-09-11 NOTE — Patient Instructions (Signed)
Please start Qvar twice a day every day even if you feel good I have refilled her albuterol for your wheezing I would like to see you in 2 weeks to make sure that you're feeling better. Please try the shoulder stretches to see if we can get her shoulder feeling better.Shoulder Range of Motion Exercises The shoulder is the most flexible joint in the human body. Because of this it is also the most unstable joint in the body. All ages can develop shoulder problems. Early treatment of problems is necessary for a good outcome. People react to shoulder pain by decreasing the movement of the joint. After a brief period of time, the shoulder can become "frozen". This is an almost complete loss of the ability to move the damaged shoulder. Following injuries your caregivers can give you instructions on exercises to keep your range of motion (ability to move your shoulder freely), or regain it if it has been lost.   EXERCISES EXERCISES TO MAINTAIN THE MOBILITY OF YOUR SHOULDER: Codman's Exercise or Pendulum Exercise  This exercise may be performed in a prone (face-down) lying position or standing while leaning on a chair with the opposite arm. Its purpose is to relax the muscles in your shoulder and slowly but surely increase the range of motion and to relieve pain.     Lie on your stomach close to the side edge of the bed. Let your weak arm hang over the edge of the bed. Relax your shoulder, arm and hand. Let your shoulder blade relax and drop down.     Slowly and gently swing your arm forward and back. Do not use your neck muscles; relax them. It might be easier to have someone else gently start swinging your arm.     As pain decreases, increase your swing. To start, arm swing should begin at 15 degree angles. In time and as pain lessens, move to 30-45 degree angles. Start with swinging for about 15 seconds, and work towards swinging for 3 to 5 minutes.     This exercise may also be performed in a  standing/bent over position.     Stand and hold onto a sturdy chair with your good arm. Bend forward at the waist and bend your knees slightly to help protect your back. Relax your weak arm, let it hang limp. Relax your shoulder blade and let it drop.     Keep your shoulder relaxed and use body motion to swing your arm in small circles.     Stand up tall and relax.     Repeat motion and change direction of circles.     Start with swinging for about 30 seconds, and work towards swinging for 3 to 5 minutes.  STRETCHING EXERCISES:  Lift your arm out in front of you with the elbow bent at 90 degrees. Using your other arm gently pull the elbow forward and across your body.     Bend one arm behind you with the palm facing outward. Using the other arm, hold a towel or rope and reach this arm up above your head, then bend it at the elbow to move your wrist to behind your neck. Grab the free end of the towel with the hand behind your back. Gently pull the towel up with the hand behind your neck, gradually increasing the pull on the hand behind the small of your back. Then, gradually pull down with the hand behind the small of your back. This will pull the hand  and arm behind your neck further. Both shoulders will have an increased range of motion with repetition of this exercise.  STRENGTHENING EXERCISES:  Standing with your arm at your side and straight out from your shoulder with the elbow bent at 90 degrees, hold onto a small weight and slowly raise your hand so it points straight up in the air. Repeat this five times to begin with, and gradually increase to ten times. Do this four times per day. As you grow stronger you can gradually increase the weight.     Repeat the above exercise, only this time using an elastic band. Start with your hand up in the air and pull down until your hand is by your side. As you grow stronger, gradually increase the amount you pull by increasing the number or size of the  elastic bands. Use the same amount of repetitions.     Standing with your hand at your side and holding onto a weight, gradually lift the hand in front of you until it is over your head. Do the same also with the hand remaining at your side and lift the hand away from your body until it is again over your head. Repeat this five times to begin with, and gradually increase to ten times. Do this four times per day. As you grow stronger you can gradually increase the weight.  Document Released: 06/16/2003 Document Revised: 05/30/2011 Document Reviewed: 09/17/2005 [facility] Patient Information 2012 [place], [place].

## 2011-09-11 NOTE — Assessment & Plan Note (Signed)
Using CPAP every day. Wakes up with dry mouth. Possibly due to psych medications. Asked her to speak to her psychiatrist about this.

## 2011-09-11 NOTE — Assessment & Plan Note (Signed)
Wheezing today. Will start controller medicine. Refilled albuterol. See back in 2 weeks. Would like to still have go to Dr. Raymondo Band for smoking cessation and PFTs.

## 2011-09-11 NOTE — Progress Notes (Signed)
  Subjective:    Patient ID: Tonya Gill, female    DOB: September 07, 1976, 35 y.o.   MRN: [id number]  HPI Asthma-patient has been using her albuterol inhaler about twice a week. She ran out after about 6 months. She is out now and she thinks she is wheezing. She denies fever cough. Sleep apnea-patient states that she is using her CPAP machine nightly. She says that she wakes up with a very dry mouth which is concerning to her. She says that she does sleep better with the machine on. 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 She has pain when she sleeps on her left shoulder. She got benefit from Tylenol but she was worried that she can take it long term. She tries to keep her self active and moving her joints.  Patient is still smoking. She has not followed up on trying to quit. She would like to use the future. Review of Systems Denies CP, SOB, HA, N/V/D, fever    Objective:   Physical Exam Vital signs reviewed General appearance - alert, well appearing, and in no distress and oriented to person, place, and time Heart - normal rate, regular rhythm, normal S1, S2, no murmurs, rubs, clicks or gallops Lungs-bibasilar wheeze no crackles or rhonchi Extremities - peripheral pulses normal, no pedal edema, no clubbing or cyanosis, left shoulder with full range of motion. No crepitus.        Assessment & Plan:

## 2011-09-11 NOTE — Assessment & Plan Note (Signed)
Patient with continued shoulder pain when she sleeps on that side. She gets benefit from the Tylenol so I asked her to keep taking her Tylenol.

## 2011-09-27 IMAGING — CT CT HEAD W/O CM
4 of 11 series · 17 of 47 positions shown, 19 images · non-contrast
Comparison: None

CT HEAD

CLINICAL DATA: Fell

CT HEAD WITHOUT CONTRAST
CT MAXILLOFACIAL WITHOUT CONTRAST
CT CERVICAL SPINE WITHOUT CONTRAST
TECHNIQUE: Multidetector CT imaging of the head, cervical spine,
and maxillofacial structures were performed using the standard
protocol without intravenous contrast. Multiplanar CT image
reconstructions of the cervical spine and maxillofacial structures
were also generated.

[Series 11: facial 2.0 h31s st · axial · 0.35mm/px · z∈[-208,-86]mm · 7 of 83 slices shown, 9 images]
[im 11/83  brain]
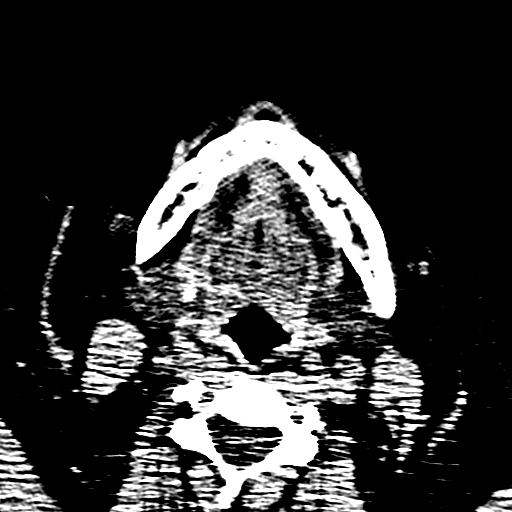
[im 11/83  bone]
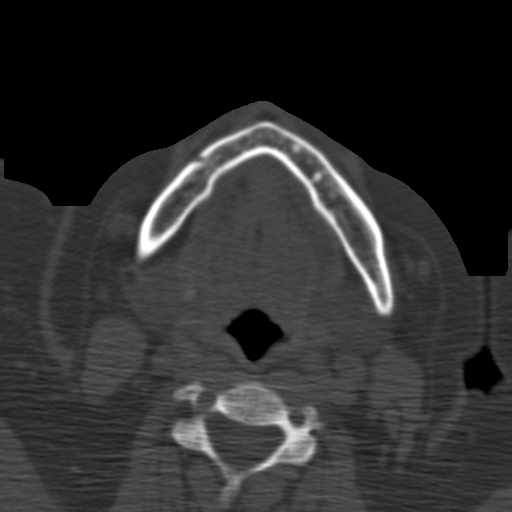
[im 21/83  brain]
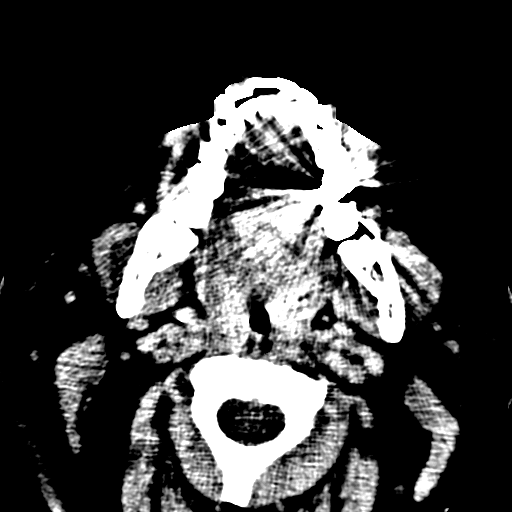
[im 31/83  brain]
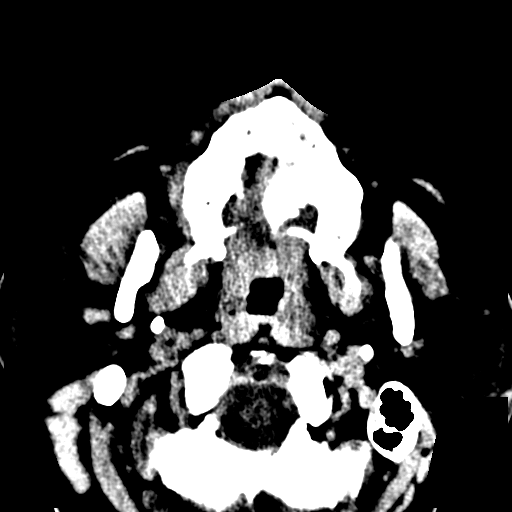
[im 42/83  brain]
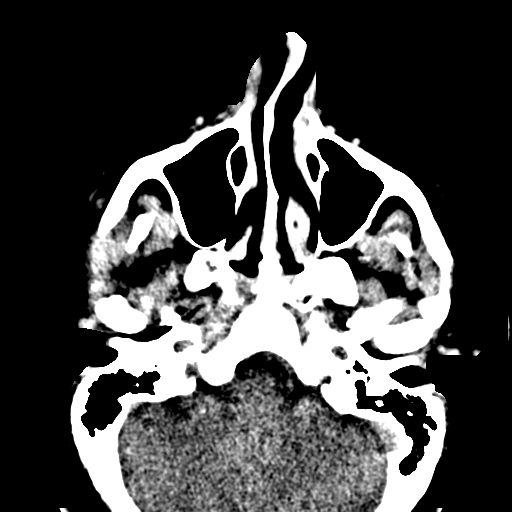
[im 52/83  brain]
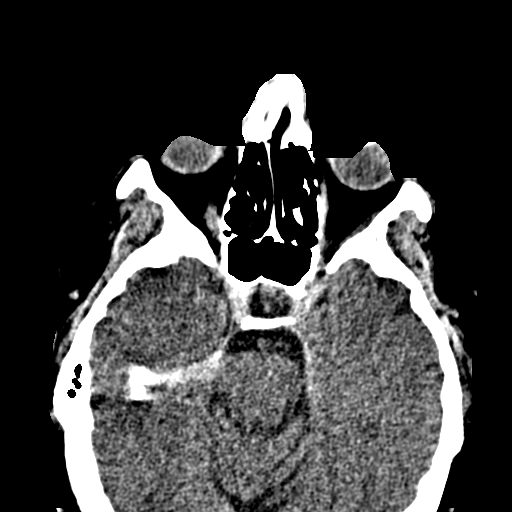
[im 52/83  bone]
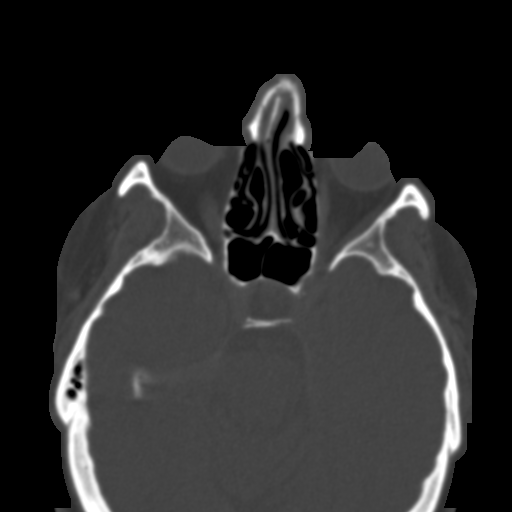
[im 62/83  brain]
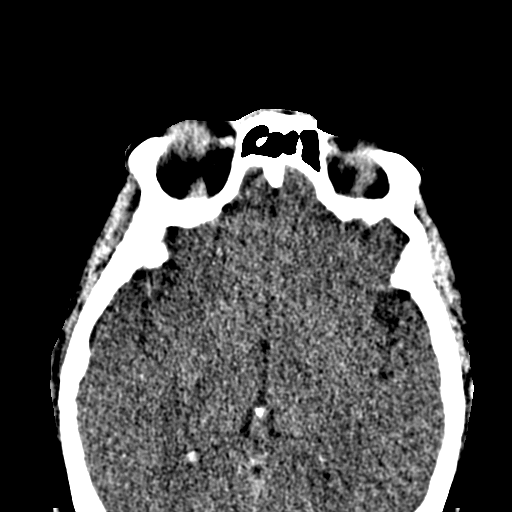
[im 72/83  brain]
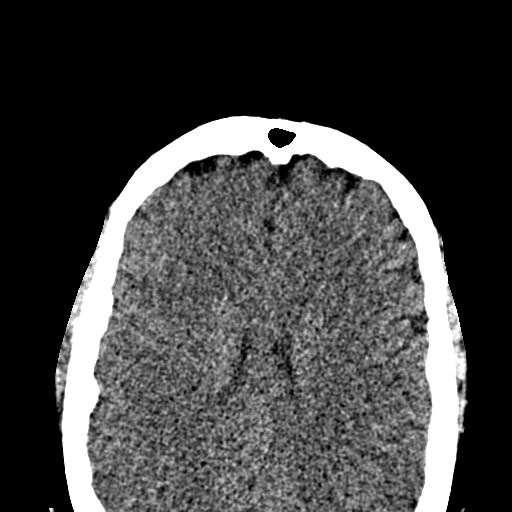

[Series 12: facial coronal · coronal · 0.39mm/px · 3 of 82 slices shown]
[im 21/82  brain]
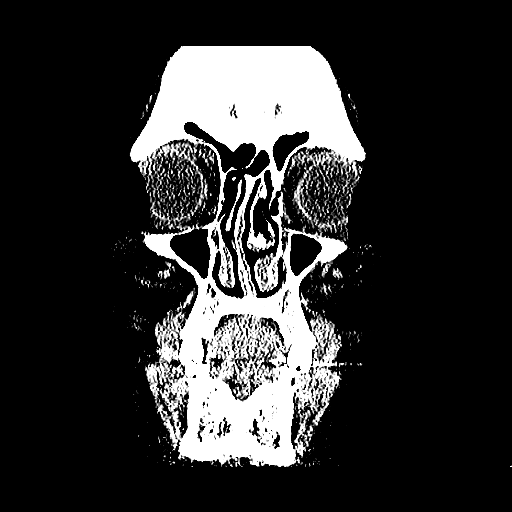
[im 41/82  brain]
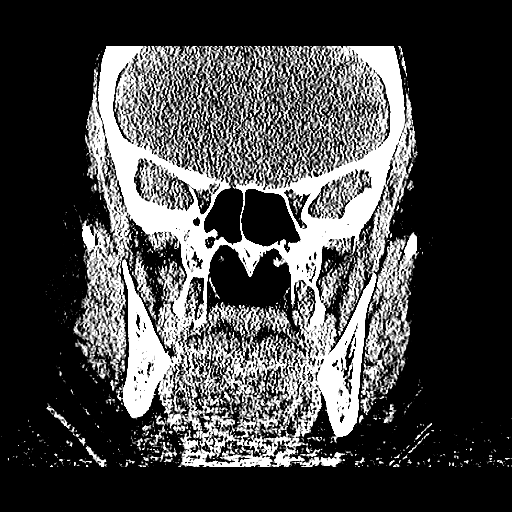
[im 61/82  brain]
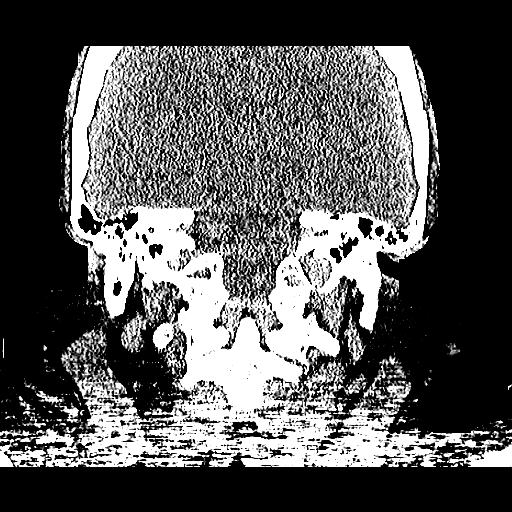

[Series 603: sagittal st · sagittal · 0.35mm/px · 2 of 59 slices shown]
[im 20/59  brain]
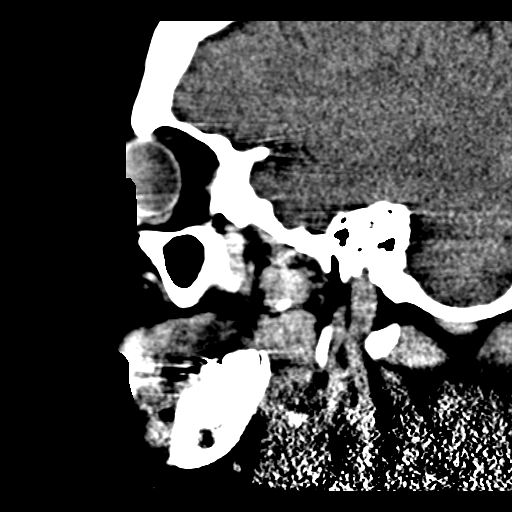
[im 39/59  brain]
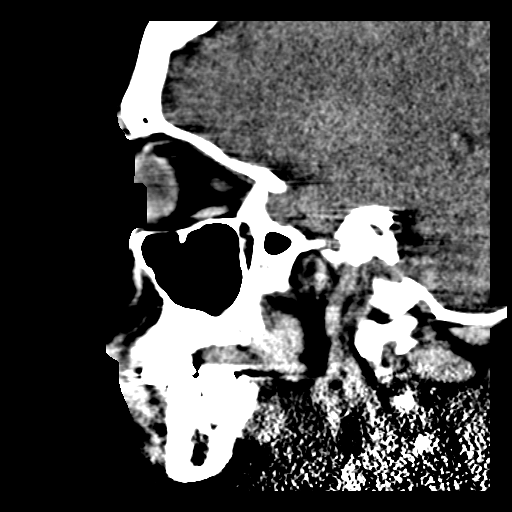

[Series 604: axials · axial · 0.35mm/px · z∈[-295,-178]mm · 5 of 60 slices shown]
[im 10/60  brain]
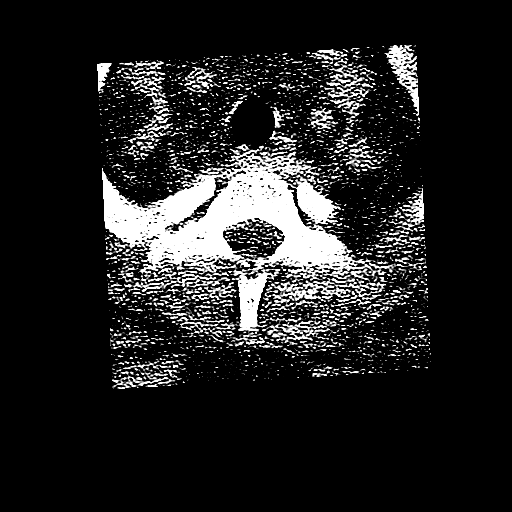
[im 20/60  brain]
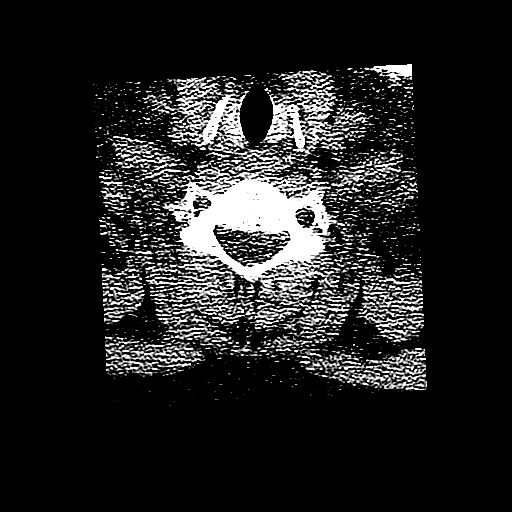
[im 30/60  brain]
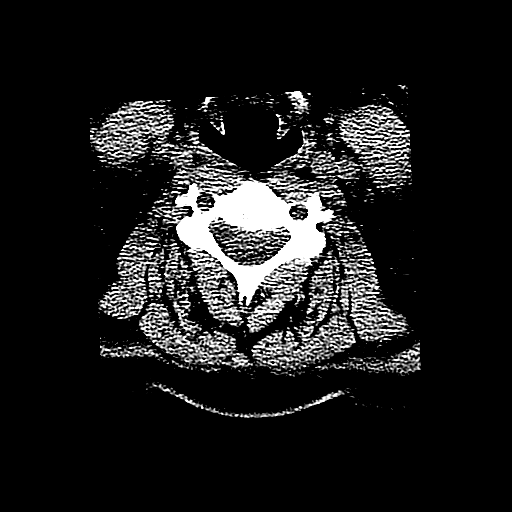
[im 40/60  brain]
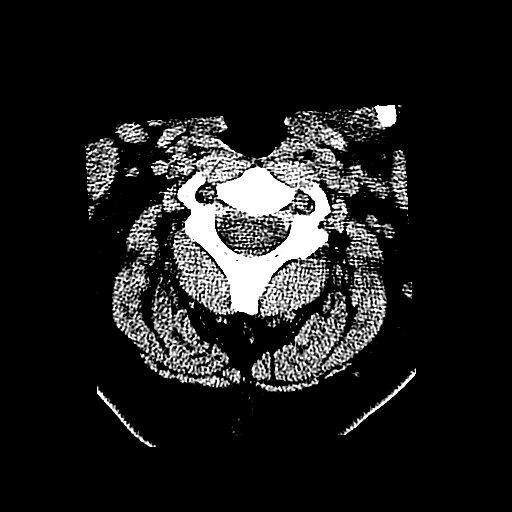
[im 50/60  brain]
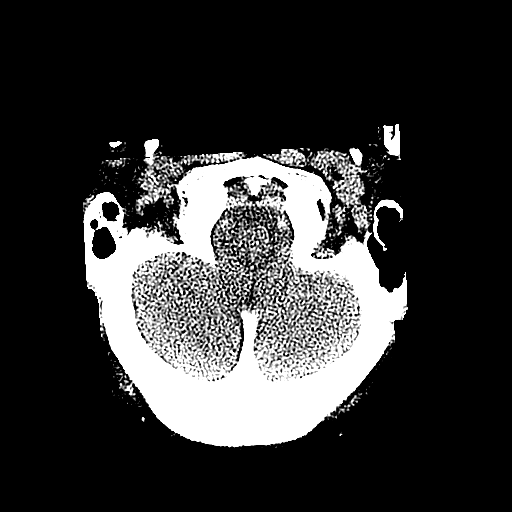

[17 of 47 positions shown; findings below may reference images not displayed]

FINDINGS: There is no evidence of acute intracranial hemorrhage,
brain edema, mass lesion, acute infarction,   mass effect, or
midline shift. Acute infarct may be inapparent on noncontrast CT.
No other intra-axial abnormalities are seen, and the ventricles and
sulci are within normal limits in size and symmetry.   No abnormal
extra-axial fluid collections or masses are identified.  No
significant calvarial abnormality.
IMPRESSION: Negative for bleed or other acute intracranial process.

CT MAXILLOFACIAL
FINDINGS: There is a 10 mm calcification along the expected course
of the parotid duct on the right.  Minimally displaced bilateral
nasal bone fractures.  No fracture of the nasal septum, displaced
to the right of midline.  Orbits and globes appear intact.  No
other fractures seen.  Mandibles intact.
IMPRESSION: 1.  Minimally displaced bilateral nasal bone and nasal septum
fractures.
2.  Large right sialolith.

CT CERVICAL SPINE
FINDINGS: Reversal of the normal cervical lordosis.  No
prevertebral soft tissue swelling.  Negative for fracture.
Regional soft tissues unremarkable.  Probable hemangioma in the C7
vertebral body.
IMPRESSION: 1.  Negative for fracture or other acute bony abnormality.
2. Loss of the normal cervical spine lordosis, which may be
secondary to positioning, spasm, or soft tissue injury.

## 2011-11-14 ENCOUNTER — Ambulatory Visit (INDEPENDENT_AMBULATORY_CARE_PROVIDER_SITE_OTHER): Payer: Medicare Other | Admitting: Family Medicine

## 2011-11-14 ENCOUNTER — Encounter: Payer: Self-pay | Admitting: Family Medicine

## 2011-11-14 VITALS — BP 132/91 | HR 89 | Temp 98.3°F | Ht 67.0 in | Wt 286.5 lb

## 2011-11-14 DIAGNOSIS — J4520 Mild intermittent asthma, uncomplicated: Secondary | ICD-10-CM

## 2011-11-14 DIAGNOSIS — J45909 Unspecified asthma, uncomplicated: Secondary | ICD-10-CM

## 2011-11-14 MED ORDER — IPRATROPIUM BROMIDE 0.02 % IN SOLN
0.5000 mg | Freq: Once | RESPIRATORY_TRACT | Status: AC
  Administered 2011-11-14: 0.5 mg via RESPIRATORY_TRACT

## 2011-11-14 MED ORDER — PREDNISONE 50 MG PO TABS: 50.0000 mg | ORAL_TABLET | Freq: Every day | ORAL | Status: DC

## 2011-11-14 MED ORDER — ALBUTEROL SULFATE (2.5 MG/3ML) 0.083% IN NEBU
2.5000 mg | INHALATION_SOLUTION | Freq: Once | RESPIRATORY_TRACT | Status: AC
  Administered 2011-11-14: 2.5 mg via RESPIRATORY_TRACT

## 2011-11-14 MED ORDER — BECLOMETHASONE DIPROPIONATE 80 MCG/ACT IN AERS: 2.0000 | INHALATION_SPRAY | Freq: Two times a day (BID) | RESPIRATORY_TRACT | Status: DC

## 2011-11-14 MED ORDER — ALBUTEROL SULFATE HFA 108 (90 BASE) MCG/ACT IN AERS: 2.0000 | INHALATION_SPRAY | Freq: Four times a day (QID) | RESPIRATORY_TRACT | Status: DC | PRN

## 2011-11-14 NOTE — Patient Instructions (Signed)
i have sent in the albuterol and QVAR as well as prednisone Take the prednisone for 5 days starting today Use 2 puffs of albuterol every 4 hours while you are awake Take the QVAR 2 puffs twice a day   Come back Friday for recheck Be seen sooner if you get worse

## 2011-11-14 NOTE — Assessment & Plan Note (Signed)
Wheezing today.  O2 sat 95.  Gave albuterol/atrovent neb in office.  Peak flow 180 pretreatment.  Plan is prednisone 50 x 5 days and albuterol scheduled at home. QVAR 2 puffs BID.   See back Friday for follow up. Discussed smoking cessation.  To see Dr. Raymondo Band for PFTs and smoking cessation.

## 2011-11-14 NOTE — Progress Notes (Signed)
  Subjective:    Patient ID: Tonya Gill, female    DOB: 10-03-1975, 36 y.o.   MRN: [id number]  HPI  Asthma Patient is here for evaluation of asthma. Patient's symptoms include dyspnea, non-productive cough and wheezing. Associated symptoms include nonproductive cough. The patient has been suffering from these symptoms for approximately several years. Symptoms have been stable since their onset. Medications used in the past to treat these symptoms include beta agonist inhalers and inhaled steroids. Suspected precipitants include smoke. Pt states that she was taking the QVAR but did not know she had refills and ran out 5 days ago.  She ran out of albuterol last week.  Last night had an attack and did not have albuterol at home.  She is still wheezing.    Smoker-1/2 to 1 ppd Review of Systems No CP no fever, no N/V/D    Objective:   Physical Exam Vital signs reviewed General appearance - alert, mildly uncomfortable Heart - normal rate, regular rhythm, normal S1, S2, no murmurs, rubs, clicks or gallops Lungs- wheeze throughout, poor air movement After albuterol/atrovent good air movt, few wheezes      Assessment & Plan:

## 2011-11-27 ENCOUNTER — Ambulatory Visit (INDEPENDENT_AMBULATORY_CARE_PROVIDER_SITE_OTHER): Payer: Medicare Other | Admitting: Pharmacist

## 2011-11-27 ENCOUNTER — Encounter: Payer: Self-pay | Admitting: Pharmacist

## 2011-11-27 DIAGNOSIS — F17200 Nicotine dependence, unspecified, uncomplicated: Secondary | ICD-10-CM

## 2011-11-27 DIAGNOSIS — IMO0002 Reserved for concepts with insufficient information to code with codable children: Secondary | ICD-10-CM

## 2011-11-27 DIAGNOSIS — J45909 Unspecified asthma, uncomplicated: Secondary | ICD-10-CM

## 2011-11-27 MED ORDER — NICOTINE 21 MG/24HR TD PT24: 1.0000 | MEDICATED_PATCH | TRANSDERMAL | Status: AC

## 2011-11-27 MED ORDER — NICOTINE POLACRILEX 2 MG MT GUM: 2.0000 mg | CHEWING_GUM | OROMUCOSAL | Status: AC | PRN
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
## 2011-11-27 NOTE — Patient Instructions (Addendum)
It was nice to meet you! Your lung-function test shows that your lung function is ok.   1) Your quit date is Monday March 4th. Try not to smoke any more cigarettes after that day 2) Between today and next Monday try to decrease the number of cigarettes to less than 10 cigarettes per day. 3) Pick up a prescription for nicotine 21 mg patches and 2 mg nicotine gum at [facility] 4) On Monday start using the nicotine patch. If you have vivid dreams, you can take the patch off before you go to sleep and then put a new one once you wake up. You can use a piece of gum for when you have cravings. Try not to use more than 8 pieces of gum per day.  Keep up the good work on trying to cut down on the beer. If able, try to increase your walking to keep you active  Follow-up in the clinic: Monday, March 11th at 9:00AM

## 2011-11-27 NOTE — Progress Notes (Signed)
  Subjective:    Patient ID: Tonya Gill, female    DOB: 1975-11-04, 36 y.o.   MRN: [id number]  HPI Reviewed and agree with Dr. Macky Lower management.    Review of Systems     Objective:   Physical Exam        Assessment & Plan:
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
## 2011-11-27 NOTE — Assessment & Plan Note (Signed)
  Spirometry evaluation with Pre and Post Bronchodilator reveals normal lung function, with little benefit from albuterol treatment.  Patient has been experiencing shortness of breath at night a few times since her last appointment and is taking QVAR and Albuterol MDI prn.  Continue current treatment plan at this time and counseled on smoking cessation.  Reviewed results of pulmonary function tests.  Pt verbalized understanding of results.  Written pt instructions provided.  F/U Clinic visit 12/10/11 for smoking cessation.  Total time in face to face counseling 45 minutes.  Patient seen with Su Hilt, PharmD Candidate and Benjaman Pott, Pharmacy Resident.  Severe Nicotine Dependence of >20 years duration in a patient who is poor candidate for success b/c of social environment, psychiatric history, and high nicotine dependence.    Initiated nicotine replacement tx 21mg  patches and 2mg  nicotine gum, with quit date of Monday March 4th, 2013. Patient counseled on purpose, proper use, and potential adverse effects, including   Written information provided.    F/U Rx Clinic Visit  12/10/11 at 9:00AM.  Total time in face-to-face counseling 45 minutes.  Patient seen with Su Hilt, PharmD Candidate and Benjaman Pott, Pharmacy Resident.

## 2011-11-27 NOTE — Assessment & Plan Note (Signed)
  Spirometry evaluation with Pre and Post Bronchodilator reveals normal lung function, with little benefit from albuterol treatment.  Patient has been experiencing shortness of breath at night a few times since her last appointment and is taking QVAR and Albuterol MDI prn.  Continue current treatment plan at this time and counseled on smoking cessation.  Reviewed results of pulmonary function tests.  Pt verbalized understanding of results.  Written pt instructions provided.  F/U Clinic visit 12/10/11 for smoking cessation.  Total time in face to face counseling 45 minutes.  Patient seen with Hugh Giovinazzo, PharmD Candidate and Kristina Sucic, Pharmacy Resident.  Severe Nicotine Dependence of >20 years duration in a patient who is poor candidate for success b/c of social environment, psychiatric history, and high nicotine dependence.    Initiated nicotine replacement tx 21mg patches and 2mg nicotine gum, with quit date of Monday March 4th, 2013. Patient counseled on purpose, proper use, and potential adverse effects, including   Written information provided.    F/U Rx Clinic Visit  12/10/11 at 9:00AM.  Total time in face-to-face counseling 45 minutes.  Patient seen with Hugh Giovinazzo, PharmD Candidate and Kristina Sucic, Pharmacy Resident. 

## 2011-12-10 ENCOUNTER — Ambulatory Visit: Payer: Medicare Other | Admitting: Pharmacist

## 2012-03-07 ENCOUNTER — Other Ambulatory Visit: Payer: Self-pay | Admitting: Family Medicine

## 2012-05-14 ENCOUNTER — Telehealth: Payer: Self-pay | Admitting: Family Medicine

## 2012-05-14 NOTE — Telephone Encounter (Signed)
Received fax from Tammy RN that said "Dr. Clyde Canterbury (psychiatrist) would like primary to follow-up and for lab follow-up".  I am not sure what the follow-up is for.   Called Tammy.  -He wants me to see patient for these abnormal labs (will have them scanned into EPIC) that he ordered on the patient as part of her care -The nurse will follow-up with the patient on Friday, and I have asked them to advise the patient to follow-up with me  -She will need a repeat CBC, peripheral smear, CMET, and urinalysis, spot protein-creatinine

## 2012-05-22 ENCOUNTER — Other Ambulatory Visit: Payer: Self-pay | Admitting: Family Medicine

## 2012-05-22 ENCOUNTER — Encounter: Payer: Self-pay | Admitting: Family Medicine

## 2012-05-22 ENCOUNTER — Ambulatory Visit (INDEPENDENT_AMBULATORY_CARE_PROVIDER_SITE_OTHER): Payer: Medicare Other | Admitting: Family Medicine

## 2012-05-22 VITALS — BP 139/88 | HR 84 | Temp 98.6°F | Ht 67.0 in | Wt 282.0 lb

## 2012-05-22 DIAGNOSIS — F259 Schizoaffective disorder, unspecified: Secondary | ICD-10-CM

## 2012-05-22 DIAGNOSIS — J45909 Unspecified asthma, uncomplicated: Secondary | ICD-10-CM

## 2012-05-22 DIAGNOSIS — F17200 Nicotine dependence, unspecified, uncomplicated: Secondary | ICD-10-CM

## 2012-05-22 DIAGNOSIS — IMO0002 Reserved for concepts with insufficient information to code with codable children: Secondary | ICD-10-CM

## 2012-05-22 DIAGNOSIS — J4520 Mild intermittent asthma, uncomplicated: Secondary | ICD-10-CM

## 2012-05-22 MED ORDER — BECLOMETHASONE DIPROPIONATE 80 MCG/ACT IN AERS: 2.0000 | INHALATION_SPRAY | Freq: Two times a day (BID) | RESPIRATORY_TRACT | Status: DC

## 2012-05-22 MED ORDER — ALBUTEROL SULFATE HFA 108 (90 BASE) MCG/ACT IN AERS: 1.0000 | INHALATION_SPRAY | RESPIRATORY_TRACT | Status: DC | PRN

## 2012-05-22 NOTE — Assessment & Plan Note (Signed)
Cutting back. Down from 1-1/2 ppd. She is working on setting a quit date. She does not want counseling or pharmacy referral.

## 2012-05-22 NOTE — Progress Notes (Signed)
  Subjective:    Patient ID: Tonya Gill, female    DOB: [birth year], 35 y.o.   MRN: [id number]  HPI # Asthma, intermittent versus mild persistent She feels more short-of-breath recently She uses QVAR and albuterol as needed. She usually uses albuterol twice a week She has frequent nocturnal coughing but denies recent nighttime awakenings ROS: denies SOB at this time, denies chest pain  Review of Systems Per HPI  Allergies, medication, past medical history reviewed.  History of tobacco abuse: she has cut back from 1 ppd to 1/2 ppd. She is using Nicorette and patches. It is very important for her to quit but she does not have a quit date in mind. She tried quitting recently but started smoking again in 1 week    Objective:   Physical Exam GEN: NAD; overweight PSYCH: appropriate to questions  CV: RRR, no m/r/g PULM: NI WOB; occasional wheeze; decreased breath sounds bilaterally bases    Assessment & Plan:

## 2012-05-22 NOTE — Assessment & Plan Note (Signed)
Advised she use QVAR 2 puffs twice daily. Consider decreasing to 1 puff twice daily at her follow-up in 1 month.

## 2012-05-22 NOTE — Patient Instructions (Addendum)
For your asthma: -Take QVAR 2 puffs twice a day every day -Use albuterol inhaler as needed  Congratulations on cutting back on your smoking  Follow-up in 1 month for a Pap smear

## 2012-07-04 ENCOUNTER — Telehealth: Payer: Self-pay | Admitting: Family Medicine

## 2012-07-04 NOTE — Telephone Encounter (Signed)
Patient is calling because at her last appt, she says she was told she needs an immunization and she wants it sent to [facility] on [address].  I am not sure what she is referring to so a nurse needs to call her.

## 2012-07-04 NOTE — Telephone Encounter (Signed)
Was this TDAP?

## 2012-07-08 ENCOUNTER — Other Ambulatory Visit: Payer: Self-pay | Admitting: Family Medicine

## 2012-07-08 MED ORDER — TETANUS-DIPHTH-ACELL PERTUSSIS 5-2.5-18.5 LF-MCG/0.5 IM SUSP: 0.5000 mL | Freq: Once | INTRAMUSCULAR | Status: DC

## 2012-07-10 ENCOUNTER — Ambulatory Visit: Payer: Medicare Other

## 2012-09-05 ENCOUNTER — Other Ambulatory Visit: Payer: Self-pay | Admitting: Family Medicine

## 2012-09-08 ENCOUNTER — Encounter: Payer: Self-pay | Admitting: Family Medicine

## 2012-09-16 NOTE — Telephone Encounter (Signed)
This encounter was created in error - please disregard.

## 2013-02-03 ENCOUNTER — Other Ambulatory Visit: Payer: Self-pay | Admitting: Family Medicine

## 2013-02-16 ENCOUNTER — Emergency Department (HOSPITAL_COMMUNITY)
Admission: EM | Admit: 2013-02-16 | Discharge: 2013-02-17 | Disposition: A | Discharge status: Home / Self Care | Payer: Medicare Other | Attending: Emergency Medicine | Admitting: Emergency Medicine

## 2013-02-16 ENCOUNTER — Encounter (HOSPITAL_COMMUNITY): Payer: Self-pay

## 2013-02-16 DIAGNOSIS — F411 Generalized anxiety disorder: Secondary | ICD-10-CM | POA: Insufficient documentation

## 2013-02-16 DIAGNOSIS — IMO0002 Reserved for concepts with insufficient information to code with codable children: Secondary | ICD-10-CM | POA: Insufficient documentation

## 2013-02-16 DIAGNOSIS — F259 Schizoaffective disorder, unspecified: Secondary | ICD-10-CM

## 2013-02-16 DIAGNOSIS — E669 Obesity, unspecified: Secondary | ICD-10-CM | POA: Insufficient documentation

## 2013-02-16 DIAGNOSIS — F250 Schizoaffective disorder, bipolar type: Secondary | ICD-10-CM | POA: Diagnosis present

## 2013-02-16 DIAGNOSIS — Z3202 Encounter for pregnancy test, result negative: Secondary | ICD-10-CM | POA: Insufficient documentation

## 2013-02-16 DIAGNOSIS — F200 Paranoid schizophrenia: Secondary | ICD-10-CM | POA: Insufficient documentation

## 2013-02-16 DIAGNOSIS — F911 Conduct disorder, childhood-onset type: Secondary | ICD-10-CM | POA: Insufficient documentation

## 2013-02-16 DIAGNOSIS — F1010 Alcohol abuse, uncomplicated: Secondary | ICD-10-CM | POA: Diagnosis present

## 2013-02-16 HISTORY — DX: Bipolar disorder, unspecified: F31.9

## 2013-02-16 HISTORY — DX: Alcohol abuse, uncomplicated: F10.10

## 2013-02-16 HISTORY — DX: Paranoid schizophrenia: F20.0

## 2013-02-16 LAB — CBC WITH DIFFERENTIAL/PLATELET
Basophils Absolute: 0.1 10*3/uL (ref 0.0–0.1)
Basophils Relative: 0 % (ref 0–1)
Lymphocytes Relative: 27 % (ref 12–46)
MCHC: 34.2 g/dL (ref 30.0–36.0)
Neutro Abs: 7.3 10*3/uL (ref 1.7–7.7)
Neutrophils Relative %: 65 % (ref 43–77)
RDW: 14.4 % (ref 11.5–15.5)
WBC: 11.3 10*3/uL — ABNORMAL HIGH (ref 4.0–10.5)

## 2013-02-16 LAB — BASIC METABOLIC PANEL
Chloride: 94 mEq/L — ABNORMAL LOW (ref 96–112)
GFR calc Af Amer: >90 mL/min (ref >90)
Potassium: 3.8 mEq/L (ref 3.5–5.1)
Sodium: 135 mEq/L (ref 135–145)

## 2013-02-16 LAB — RAPID URINE DRUG SCREEN, HOSP PERFORMED
Barbiturates: NOT DETECTED
Benzodiazepines: NOT DETECTED
Cocaine: NOT DETECTED
Tetrahydrocannabinol: POSITIVE — AB

## 2013-02-16 LAB — ETHANOL: Alcohol, Ethyl (B): 220 mg/dL — ABNORMAL HIGH (ref 0–11)

## 2013-02-16 MED ORDER — HYDROXYZINE HCL 25 MG PO TABS
25.0000 mg | ORAL_TABLET | Freq: Every day | ORAL | Status: DC
  Administered 2013-02-16: 25 mg via ORAL
  Filled 2013-02-16: qty 1

## 2013-02-16 MED ORDER — BENZTROPINE MESYLATE 1 MG PO TABS
1.0000 mg | ORAL_TABLET | Freq: Every day | ORAL | Status: DC
  Administered 2013-02-16: 1 mg via ORAL
  Filled 2013-02-16: qty 1

## 2013-02-16 MED ORDER — ZOLPIDEM TARTRATE 5 MG PO TABS: 5.0000 mg | ORAL_TABLET | Freq: Every evening | ORAL | Status: DC | PRN

## 2013-02-16 MED ORDER — LORAZEPAM 0.5 MG PO TABS
0.5000 mg | ORAL_TABLET | Freq: Every day | ORAL | Status: DC
  Administered 2013-02-16: 0.5 mg via ORAL
  Filled 2013-02-16: qty 1

## 2013-02-16 MED ORDER — RISPERIDONE 1 MG PO TABS
1.0000 mg | ORAL_TABLET | Freq: Two times a day (BID) | ORAL | Status: DC
  Administered 2013-02-16 – 2013-02-17 (×2): 1 mg via ORAL
  Filled 2013-02-16 (×2): qty 1

## 2013-02-16 MED ORDER — NICOTINE 21 MG/24HR TD PT24
21.0000 mg | MEDICATED_PATCH | Freq: Every day | TRANSDERMAL | Status: DC
  Filled 2013-02-16: qty 1

## 2013-02-16 MED ORDER — IBUPROFEN 200 MG PO TABS: 600.0000 mg | ORAL_TABLET | Freq: Three times a day (TID) | ORAL | Status: DC | PRN

## 2013-02-16 MED ORDER — ALUM & MAG HYDROXIDE-SIMETH 200-200-20 MG/5ML PO SUSP: 30.0000 mL | ORAL | Status: DC | PRN

## 2013-02-16 MED ORDER — ONDANSETRON HCL 4 MG PO TABS: 4.0000 mg | ORAL_TABLET | Freq: Three times a day (TID) | ORAL | Status: DC | PRN

## 2013-02-16 MED ORDER — ACETAMINOPHEN 325 MG PO TABS: 650.0000 mg | ORAL_TABLET | ORAL | Status: DC | PRN

## 2013-02-16 NOTE — ED Provider Notes (Signed)
History    This chart was scribed for non-physician practitioner, Dorthula Matas PA-C working with Hurman Horn, MD by Donne Anon, ED Scribe. This patient was seen in room WTR4/WLPT4 and the patient's care was started at 1648.   CSN: [id number]  Arrival date & time 02/16/13  1642   First MD Initiated Contact with Patient 02/16/13 1648      No chief complaint on file.    The history is provided by the police and a relative. No language interpreter was used.   HPI Comments: Tonya Gill is a 37 y.o. female brought in by [facility] police, who presents to the Emergency Department complaining of psychosis. Per family, she has a hx paranoid-schizophrenic and bipolar disorder and is not currently taking her medication and hoarding the pills in a cardboard box. She drinks heavily everyday (12 beers/day) and smokes marijuana. She has walked around the residence with minimal clothing regardless of who is present. She has been verbally aggressive, abusive and threatening. She has been known to scream and curse at passing cars and pedestrians. Her family believes she is a danger to herself and possibly others.  LEVEL 5 CAVEAT:  psychosis  No past medical history on file.  No past surgical history on file.  No family history on file.  History  Substance Use Topics  . Smoking status: Current Every Day Smoker -- 1.00 packs/day    Types: Cigarettes  . Smokeless tobacco: Not on file     Comment: started smoking as a child; current smoking 15-25cig/day  . Alcohol Use: Not on file    OB History   Grav Para Term Preterm Abortions TAB SAB Ect Mult Living                  Review of Systems  Psychiatric/Behavioral: Positive for behavioral problems.  All other systems reviewed and are negative.  LEVEL 5 CAVEAT:  psychosis  Allergies  Review of patient's allergies indicates no known allergies.  Home Medications   Current Outpatient Rx  Name  Route  Sig  Dispense  Refill  .  benztropine (COGENTIN) 1 MG tablet   Oral   Take 1 mg by mouth at bedtime.         . hydrOXYzine (ATARAX/VISTARIL) 25 MG tablet   Oral   Take 25 mg by mouth at bedtime.          Marland Kitchen LORazepam (ATIVAN) 0.5 MG tablet   Oral   Take 0.5 mg by mouth at bedtime.           . risperiDONE (RISPERDAL) 1 MG tablet   Oral   Take 1 mg by mouth 2 (two) times daily.         . risperiDONE microspheres (RISPERDAL CONSTA) 37.5 MG injection   Intramuscular   Inject 37.5 mg into the muscle every 14 (fourteen) days.           There were no vitals taken for this visit.  Physical Exam  Nursing note and vitals reviewed. Constitutional: She is oriented to person, place, and time. She appears well-developed and well-nourished. No distress.  Disheveled   HENT:  Head: Normocephalic and atraumatic.  Eyes: EOM are normal. Pupils are equal, round, and reactive to light.  Neck: Normal range of motion. Neck supple. No tracheal deviation present.  Cardiovascular: Normal rate and regular rhythm.   Pulmonary/Chest: Effort normal. No respiratory distress.  Abdominal: Soft.  Musculoskeletal: Normal range of motion.  Neurological: She is alert  and oriented to person, place, and time.  Skin: Skin is warm and dry.  Psychiatric:  Pt acutely psychotic and denies knowing why she is here. Seems distracted. She is inattentive.    ED Course  Procedures (including critical care time) DIAGNOSTIC STUDIES: None performed.  COORDINATION OF CARE: 4:56 PM Discussed treatment plan which includes labs and ACT consult with pt at bedside and pt agreed to plan.     Labs Reviewed  CBC WITH DIFFERENTIAL  BASIC METABOLIC PANEL  ETHANOL  URINE RAPID DRUG SCREEN (HOSP PERFORMED)  PREGNANCY, URINE   No results found.   1. Paranoid schizophrenia       MDM  ACT consulted, holding orders placed, med rec completed.  I personally performed the services described in this documentation, which was scribed in my  presence. The recorded information has been reviewed and is accurate.        Dorthula Matas, PA-C 02/16/13 1729

## 2013-02-16 NOTE — ED Notes (Signed)
Pt transferred from triage, presents for medical clearance.  Pt is IVC, not forthcoming with information.  Poor historian.  IVC papers report pt is Paranoid-schizophrenic and Bipolar, noncompliant with meds.  Papers state pt drinks heavily and uses marijuana.  Pt is verbally aggressive, abusive and threatening, Screaming & cursing at passing cars and pedestrians.  Pt calm & cooperative at present.

## 2013-02-16 NOTE — ED Notes (Addendum)
Patient brought in by [employer] with IVC papers. Patient does not answer questions asked verbally, but will nod her head indicating yes/no to questions asked. Patient denies HI/SI. IVC papers state that the patient is verbally abusive and has aggressive behavior towards others. Patient has been going outside with very little clothing and yelling at cars and pedestrians.  Patient smells of alcohol. IVC papers state that the patient is a danger to herself and others.

## 2013-02-17 DIAGNOSIS — F1010 Alcohol abuse, uncomplicated: Secondary | ICD-10-CM

## 2013-02-17 DIAGNOSIS — F200 Paranoid schizophrenia: Secondary | ICD-10-CM

## 2013-02-17 DIAGNOSIS — F259 Schizoaffective disorder, unspecified: Secondary | ICD-10-CM

## 2013-02-17 NOTE — ED Provider Notes (Signed)
Pt sleeping; is IVC for EtOH abuse and suspected psychosis. 0030  Hurman Horn, MD 02/23/13 [phone]

## 2013-02-17 NOTE — ED Notes (Signed)
Attempted to speak with pt., pt. Not responding to questions, pt. In bed, not in distress, breathing wnl.

## 2013-02-17 NOTE — Consult Note (Signed)
Reason for Consult: Patient brought into hospital IVC danger to self and other Referring Physician: Dorena Dew. Neva Seat, PA-C  Tonya Gill is an 37 y.o. female.  HPI: Patient brought in to ED by [facility] police with complaints of psychosis and hoarding medications not taking them; walking around the home dressed inappropriately now matter who is present and yelling at passing cars.  Family feels that patient is a danger to self and other.  Mental Status Exam:  Please see physical assessment mental note  Past Medical History  Diagnosis Date  . Alcohol abuse   . Bipolar 1 disorder   . Paranoid schizophrenia     History reviewed. No pertinent past surgical history.  No family history on file.  Social History:  reports that she has been smoking Cigarettes.  She has been smoking about 1.00 pack per day. She has never used smokeless tobacco. She reports that  drinks alcohol. She reports that she uses illicit drugs (Marijuana).  Allergies: No Known Allergies  Medications: I have reviewed the patient's current medications. Prior to Admission:  (Not in a hospital admission)  Results for orders placed during the hospital encounter of 02/16/13 (from the past 48 hour(s))  CBC WITH DIFFERENTIAL     Status: Abnormal   Collection Time    02/16/13  5:38 PM      Result Value Range   WBC 11.3 (*) 4.0 - 10.5 K/uL   RBC 5.71 (*) 3.87 - 5.11 MIL/uL   Hemoglobin 18.5 (*) 12.0 - 15.0 g/dL   HCT 53.6 (*) 64.4 - 03.4 %   MCV 94.7  78.0 - 100.0 fL   MCH 32.4  26.0 - 34.0 pg   MCHC 34.2  30.0 - 36.0 g/dL   RDW 74.2  59.5 - 63.8 %   Platelets 187  150 - 400 K/uL   Neutrophils Relative % 65  43 - 77 %   Neutro Abs 7.3  1.7 - 7.7 K/uL   Lymphocytes Relative 27  12 - 46 %   Lymphs Abs 3.1  0.7 - 4.0 K/uL   Monocytes Relative 6  3 - 12 %   Monocytes Absolute 0.7  0.1 - 1.0 K/uL   Eosinophils Relative 2  0 - 5 %   Eosinophils Absolute 0.2  0.0 - 0.7 K/uL   Basophils Relative 0  0 - 1 %   Basophils Absolute 0.1  0.0 - 0.1 K/uL  BASIC METABOLIC PANEL     Status: Abnormal   Collection Time    02/16/13  5:38 PM      Result Value Range   Sodium 135  135 - 145 mEq/L   Potassium 3.8  3.5 - 5.1 mEq/L   Chloride 94 (*) 96 - 112 mEq/L   CO2 27  19 - 32 mEq/L   Glucose, Bld 90  70 - 99 mg/dL   BUN 8  6 - 23 mg/dL   Creatinine, Ser 7.56  0.50 - 1.10 mg/dL   Calcium 9.1  8.4 - 43.3 mg/dL   GFR calc non Af Amer >90  >90 mL/min   GFR calc Af Amer >90  >90 mL/min   Comment:            The eGFR has been calculated     using the CKD EPI equation.     This calculation has not been     validated in all clinical     situations.     eGFR's persistently     <  90 mL/min signify     possible Chronic Kidney Disease.  ETHANOL     Status: Abnormal   Collection Time    02/16/13  5:38 PM      Result Value Range   Alcohol, Ethyl (B) 220 (*) 0 - 11 mg/dL   Comment:            LOWEST DETECTABLE LIMIT FOR     SERUM ALCOHOL IS 11 mg/dL     FOR MEDICAL PURPOSES ONLY  URINE RAPID DRUG SCREEN (HOSP PERFORMED)     Status: Abnormal   Collection Time    02/16/13 11:15 PM      Result Value Range   Opiates NONE DETECTED  NONE DETECTED   Cocaine NONE DETECTED  NONE DETECTED   Benzodiazepines NONE DETECTED  NONE DETECTED   Amphetamines NONE DETECTED  NONE DETECTED   Tetrahydrocannabinol POSITIVE (*) NONE DETECTED   Barbiturates NONE DETECTED  NONE DETECTED   Comment:            DRUG SCREEN FOR MEDICAL PURPOSES     ONLY.  IF CONFIRMATION IS NEEDED     FOR ANY PURPOSE, NOTIFY LAB     WITHIN 5 DAYS.                LOWEST DETECTABLE LIMITS     FOR URINE DRUG SCREEN     Drug Class       Cutoff (ng/mL)     Amphetamine      1000     Barbiturate      200     Benzodiazepine   200     Tricyclics       300     Opiates          300     Cocaine          300     THC              50  PREGNANCY, URINE     Status: None   Collection Time    02/16/13 11:15 PM      Result Value Range   Preg Test, Ur  NEGATIVE  NEGATIVE   Comment: REPEATED TO VERIFY                THE SENSITIVITY OF THIS     METHODOLOGY IS >20 mIU/mL.    No results found.  Review of Systems  Constitutional: Negative.   HENT: Negative.   Eyes: Negative.   Respiratory: Negative.   Cardiovascular: Negative.   Gastrointestinal: Negative.   Genitourinary: Negative.   Musculoskeletal: Negative.   Skin: Negative.   Neurological: Negative.   Endo/Heme/Allergies: Negative.   Psychiatric/Behavioral: Positive for substance abuse (THC and alcohol). Negative for depression, suicidal ideas, hallucinations and memory loss. The patient is nervous/anxious. The patient does not have insomnia.        Patient states that she doesn't drink every day.  "I drink when I get money and when I get the money I will drink a 40.  I don't have money everyday to drink."   Blood pressure 148/86, pulse 113, temperature 99 F (37.2 C), temperature source Oral, resp. rate 20, height 5\' 7"  (1.702 m), weight 138.149 kg (304 lb 9 oz), SpO2 90.00%. Physical Exam  Constitutional: She is oriented to person, place, and time. She appears well-developed and well-nourished.  HENT:  Head: Normocephalic and atraumatic.  Right Ear: External ear normal.  Left Ear: External ear normal.  Mouth/Throat:  Oropharynx is clear and moist.  Eyes: Conjunctivae and EOM are normal. Pupils are equal, round, and reactive to light.  Neck: Normal range of motion. Neck supple.  Cardiovascular: Normal rate, regular rhythm and normal heart sounds.   Respiratory: Effort normal and breath sounds normal.  GI: Soft. Bowel sounds are normal. There is no tenderness.  Obese female   Musculoskeletal: Normal range of motion.  Lymphadenopathy:    She has no cervical adenopathy.  Neurological: She is alert and oriented to person, place, and time.  Skin: Skin is warm and dry.  Psychiatric: Her speech is normal. Her mood appears anxious. Her affect is angry and blunt. She is agitated.  She is not aggressive, not hyperactive, not slowed, not withdrawn, not actively hallucinating and not combative. Thought content is not paranoid. Cognition and memory are impaired. She expresses impulsivity. She expresses no homicidal and no suicidal ideation. She is attentive.    Assessment/Plan: Spoke with [employer] who is going to have patients nurse Nonnie Done) [phone]  to call to give the following information:   When was the last Risperdal injection; Is patient compliant with medications and appointments; Will there be someone who can follow up with patient more than once a week?  Spoke with Ofilia Neas 2nd time and states that patient got her Risperdal injection Feb 13, 2013.  Patient is visited by someone on treatment team at least 2-3 times a  week.  Ms. Lavonna Monarch states that she will send Nurse who visits patient another message to call.  Stated that I really needed to no if the patient was compliant with treatment before discharged home.    Nurse Derl Barrow reported that patient was compliant for the most part with treatment and that patient is seen 3-4 times a week.  Patient will be visited tomorrow by  ACT team and next injection or Risperdal was due on 02/28/2013 and they would give.   Recommendations:  Patient to be discharged home and follow up with scheduled OP services tomorrow (ACT counselor,.   Rourke Mcquitty B. Reno Clasby FNP-BC Family Nurse Practitioner, Board Certified  Tonya Gill 02/17/2013, 9:53 AM
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
## 2013-02-23 NOTE — ED Provider Notes (Signed)
Medical screening examination/treatment/procedure(s) were performed by non-physician practitioner and as supervising physician I was immediately available for consultation/collaboration.   Hurman Horn, MD 02/23/13 [phone]

## 2013-06-06 ENCOUNTER — Other Ambulatory Visit: Payer: Self-pay | Admitting: Family Medicine

## 2013-06-09 NOTE — Telephone Encounter (Signed)
Left message for pt to call back.  She needs an appt to refill inhaler again.  Jazmin Hartsell,CMA\

## 2013-06-09 NOTE — Telephone Encounter (Signed)
Patient has not been seen in over a year. Will refill one inhaler, but patient will need to be seen prior to additional refills. Please inform patient. Thanks.

## 2013-08-07 ENCOUNTER — Other Ambulatory Visit: Payer: Self-pay | Admitting: Family Medicine

## 2013-08-07 NOTE — Telephone Encounter (Signed)
Will send in one refill. Patient needs to be seen for follow-up before additional refills will be sent in.

## 2013-10-18 ENCOUNTER — Other Ambulatory Visit: Payer: Self-pay | Admitting: Family Medicine

## 2013-10-19 NOTE — Telephone Encounter (Signed)
Pt called and needs a refill for her inhaler. jw

## 2013-10-20 NOTE — Telephone Encounter (Signed)
Rx sent in for Dr. Birdie SonsSonnenberg. Pt will need PCP visit for further refills.

## 2013-12-28 ENCOUNTER — Other Ambulatory Visit: Payer: Self-pay | Admitting: *Deleted

## 2013-12-28 MED ORDER — ALBUTEROL SULFATE HFA 108 (90 BASE) MCG/ACT IN AERS: 1.0000 | INHALATION_SPRAY | RESPIRATORY_TRACT | Status: DC | PRN

## 2013-12-28 NOTE — Telephone Encounter (Signed)
LM for patient to call back and schedule a follow up appt.  Jazmin Hartsell,CMA  

## 2013-12-28 NOTE — Telephone Encounter (Signed)
One refill given. Patient has not been seen in clinic since 2013. She should be advised to follow-up in clinic sometime in the next month for her asthma.

## 2014-02-02 ENCOUNTER — Ambulatory Visit: Payer: Medicare Other | Admitting: Family Medicine

## 2014-02-17 ENCOUNTER — Other Ambulatory Visit: Payer: Self-pay | Admitting: Family Medicine

## 2014-02-18 NOTE — Telephone Encounter (Signed)
Patient has not been seen in the office since August 2013. I will give one refill and she should be advised that she needs to follow-up in the office within the next month.

## 2014-02-18 NOTE — Telephone Encounter (Signed)
LM for patient to call back.  Please inform of message when she calls back.  Thanks Limited BrandsJazmin Camilah Gill,CMA

## 2014-04-08 ENCOUNTER — Other Ambulatory Visit: Payer: Self-pay | Admitting: Family Medicine

## 2014-04-08 NOTE — Telephone Encounter (Signed)
Patient has not been seen in our office in almost 2 years. She has been given refills several times in the last 9 months under the condition that she comes in for an appointment. She has yet to come in for an appointment and no showed for an appointment in May. Please call her to inform her that she needs to be seen prior to having a refill given. Thanks.

## 2014-04-09 ENCOUNTER — Encounter: Payer: Self-pay | Admitting: *Deleted

## 2014-04-09 NOTE — Telephone Encounter (Signed)
Mailed letter. Fleeger, Jessica Dawn  

## 2014-04-15 ENCOUNTER — Encounter: Payer: Self-pay | Admitting: Family Medicine

## 2014-04-15 ENCOUNTER — Ambulatory Visit (INDEPENDENT_AMBULATORY_CARE_PROVIDER_SITE_OTHER): Payer: Medicare Other | Admitting: Family Medicine

## 2014-04-15 VITALS — BP 150/80 | HR 64 | Temp 98.4°F | Ht 67.0 in | Wt 312.0 lb

## 2014-04-15 DIAGNOSIS — F17200 Nicotine dependence, unspecified, uncomplicated: Secondary | ICD-10-CM

## 2014-04-15 DIAGNOSIS — R0989 Other specified symptoms and signs involving the circulatory and respiratory systems: Secondary | ICD-10-CM | POA: Insufficient documentation

## 2014-04-15 DIAGNOSIS — Z23 Encounter for immunization: Secondary | ICD-10-CM

## 2014-04-15 DIAGNOSIS — J45909 Unspecified asthma, uncomplicated: Secondary | ICD-10-CM

## 2014-04-15 DIAGNOSIS — IMO0002 Reserved for concepts with insufficient information to code with codable children: Secondary | ICD-10-CM

## 2014-04-15 MED ORDER — ALBUTEROL SULFATE HFA 108 (90 BASE) MCG/ACT IN AERS: INHALATION_SPRAY | RESPIRATORY_TRACT | Status: DC

## 2014-04-15 MED ORDER — NICOTINE 14 MG/24HR TD PT24: 14.0000 mg | MEDICATED_PATCH | Freq: Every day | TRANSDERMAL | Status: DC

## 2014-04-15 MED ORDER — BECLOMETHASONE DIPROPIONATE 40 MCG/ACT IN AERS: 1.0000 | INHALATION_SPRAY | Freq: Two times a day (BID) | RESPIRATORY_TRACT | Status: DC

## 2014-04-15 MED ORDER — GUAIFENESIN ER 600 MG PO TB12: 1200.0000 mg | ORAL_TABLET | Freq: Two times a day (BID) | ORAL | Status: DC | PRN

## 2014-04-15 NOTE — Assessment & Plan Note (Signed)
Patient wishes to quit smoking. Currently smoking 1 ppd. Will give prescription for nicotine patch. Will need to continue to reinforce smoking cessation with patient.

## 2014-04-15 NOTE — Assessment & Plan Note (Signed)
Patient with chest congestion "since this winter." Suspect this is related to smoking and under treated asthma. Patient with normal range O2 sat and afebrile and no abnormal lung sounds to indicate pulmonary infection. Will give trial of mucinex. Will give nicotine patch to help with smoking cessation. Will add qvar to asthma treatment as well. Given return precautions. F/u in 1 month.

## 2014-04-15 NOTE — Progress Notes (Signed)
Patient ID: Tonya Gill, female   DOB: January 20, 1976, 38 y.o.   MRN: [id number]  Tonya AlarEric Jandiel Magallanes, MD Phone: [phone]  Tonya Gill is a 38 y.o. female who presents today for f/u.  Asthma: patient has not been seen since 2013 for her asthma when she was seen by Dr Madolyn Friezeh Park. She reports that she has been using her albuterol inhaler 3x/week. She denies night time awakenings. She has not been to the ED for her asthma during that time. She states she no longer takes her qvar. She uses the albuterol when she feels like she is wheezing.  Chest congestion: patient reports she has had "a chest cold since this winter." She has had cough and occasionally coughs up white sputum. She denies fevers and chills. She denies chest pain. She notes infrequent shortness of breath, though none now. She states she does not know what other symptoms she has had. When asked about congestion and post-nasal drip she states she does not know. She states she smokes 1/2 PPD cigarettes, though "I do not inhale." She lives with smokers as well.  Patient is a smoker.   ROS: Per HPI   Physical Exam Filed Vitals:   04/15/14 1126  BP: 150/80  Pulse: 64  Temp: 98.4 F (36.9 C)   Gen: Well NAD, staring at the floor during the encounter HEENT: PERRL,  MMM, bilateral TM normal, no OP erythema, no cervical LAD Lungs: CTABL Nl WOB, no wheezes or crackles Heart: RRR no MRG Exts: Non edematous BL  LE, warm and well perfused.    Assessment/Plan: Please see individual problem list.  # Healthcare maintenance: tdap given

## 2014-04-15 NOTE — Patient Instructions (Addendum)
Nice to meet you. Please start using the qvar inhaler each day to help with your asthma. The most important thing you can do is to quit smoking. This is likely making your asthma worse and causing your cough. If your symptoms do not improve with this new inhaler please let us know.

## 2014-04-15 NOTE — Assessment & Plan Note (Signed)
Patient with multiple times a week use of albuterol. Will place her back on qvar one puff BID to see if this helps. Discussed smoking cessation as beneficial to her asthma. Given return precautions. F/u in 1 month.

## 2014-07-08 ENCOUNTER — Emergency Department (HOSPITAL_COMMUNITY): Payer: Medicare Other

## 2014-07-08 ENCOUNTER — Ambulatory Visit (INDEPENDENT_AMBULATORY_CARE_PROVIDER_SITE_OTHER): Payer: Medicare Other | Admitting: Family Medicine

## 2014-07-08 ENCOUNTER — Encounter: Payer: Self-pay | Admitting: Family Medicine

## 2014-07-08 ENCOUNTER — Encounter (HOSPITAL_COMMUNITY): Payer: Self-pay | Admitting: Emergency Medicine

## 2014-07-08 ENCOUNTER — Other Ambulatory Visit: Payer: Self-pay

## 2014-07-08 ENCOUNTER — Emergency Department (HOSPITAL_COMMUNITY)
Admission: EM | Admit: 2014-07-08 | Discharge: 2014-07-08 | Disposition: A | Discharge status: Home / Self Care | Payer: Medicare Other | Attending: Emergency Medicine | Admitting: Emergency Medicine

## 2014-07-08 VITALS — BP 129/94 | HR 106 | Temp 97.9°F | Ht 67.0 in | Wt 321.3 lb

## 2014-07-08 DIAGNOSIS — M791 Myalgia: Secondary | ICD-10-CM

## 2014-07-08 DIAGNOSIS — Z7951 Long term (current) use of inhaled steroids: Secondary | ICD-10-CM | POA: Insufficient documentation

## 2014-07-08 DIAGNOSIS — Z79899 Other long term (current) drug therapy: Secondary | ICD-10-CM | POA: Insufficient documentation

## 2014-07-08 DIAGNOSIS — R0981 Nasal congestion: Secondary | ICD-10-CM | POA: Insufficient documentation

## 2014-07-08 DIAGNOSIS — F200 Paranoid schizophrenia: Secondary | ICD-10-CM | POA: Diagnosis not present

## 2014-07-08 DIAGNOSIS — F319 Bipolar disorder, unspecified: Secondary | ICD-10-CM | POA: Insufficient documentation

## 2014-07-08 DIAGNOSIS — R0989 Other specified symptoms and signs involving the circulatory and respiratory systems: Secondary | ICD-10-CM

## 2014-07-08 DIAGNOSIS — R0902 Hypoxemia: Secondary | ICD-10-CM | POA: Insufficient documentation

## 2014-07-08 DIAGNOSIS — R0602 Shortness of breath: Secondary | ICD-10-CM | POA: Insufficient documentation

## 2014-07-08 DIAGNOSIS — M7918 Myalgia, other site: Secondary | ICD-10-CM | POA: Insufficient documentation

## 2014-07-08 DIAGNOSIS — R05 Cough: Secondary | ICD-10-CM | POA: Diagnosis not present

## 2014-07-08 DIAGNOSIS — Z720 Tobacco use: Secondary | ICD-10-CM | POA: Insufficient documentation

## 2014-07-08 LAB — COMPREHENSIVE METABOLIC PANEL
ALBUMIN: 3.2 g/dL — AB (ref 3.5–5.2)
ALT: 16 U/L (ref 0–35)
AST: 18 U/L (ref 0–37)
Alkaline Phosphatase: 116 U/L (ref 39–117)
Anion gap: 10 (ref 5–15)
BUN: 10 mg/dL (ref 6–23)
CALCIUM: 9.2 mg/dL (ref 8.4–10.5)
CO2: 32 mEq/L (ref 19–32)
Chloride: 99 mEq/L (ref 96–112)
Creatinine, Ser: 0.75 mg/dL (ref 0.50–1.10)
GFR calc non Af Amer: >90 mL/min (ref >90)
Glucose, Bld: 102 mg/dL — ABNORMAL HIGH (ref 70–99)
POTASSIUM: 5 meq/L (ref 3.7–5.3)
SODIUM: 141 meq/L (ref 137–147)
TOTAL PROTEIN: 7.6 g/dL (ref 6.0–8.3)
Total Bilirubin: 0.4 mg/dL (ref 0.3–1.2)

## 2014-07-08 LAB — CBC
HCT: 57.9 % — ABNORMAL HIGH (ref 36.0–46.0)
Hemoglobin: 18.9 g/dL — ABNORMAL HIGH (ref 12.0–15.0)
MCH: 31 pg (ref 26.0–34.0)
MCHC: 32.6 g/dL (ref 30.0–36.0)
MCV: 94.9 fL (ref 78.0–100.0)
PLATELETS: 170 10*3/uL (ref 150–400)
RBC: 6.1 MIL/uL — ABNORMAL HIGH (ref 3.87–5.11)
RDW: 17.7 % — ABNORMAL HIGH (ref 11.5–15.5)
WBC: 9.4 10*3/uL (ref 4.0–10.5)

## 2014-07-08 LAB — PRO B NATRIURETIC PEPTIDE: Pro B Natriuretic peptide (BNP): 174.3 pg/mL — ABNORMAL HIGH (ref 0–125)

## 2014-07-08 LAB — I-STAT TROPONIN, ED: Troponin i, poc: 0 ng/mL (ref 0.00–0.08)

## 2014-07-08 LAB — D-DIMER, QUANTITATIVE (NOT AT ARMC): D-Dimer, Quant: 0.45 ug/mL-FEU (ref 0.00–0.48)

## 2014-07-08 MED ORDER — ALBUTEROL SULFATE (2.5 MG/3ML) 0.083% IN NEBU
2.5000 mg | INHALATION_SOLUTION | Freq: Once | RESPIRATORY_TRACT | Status: AC
  Administered 2014-07-08: 2.5 mg via RESPIRATORY_TRACT

## 2014-07-08 MED ORDER — ALBUTEROL SULFATE (2.5 MG/3ML) 0.083% IN NEBU
5.0000 mg | INHALATION_SOLUTION | Freq: Once | RESPIRATORY_TRACT | Status: AC
  Administered 2014-07-08: 5 mg via RESPIRATORY_TRACT
  Filled 2014-07-08: qty 6

## 2014-07-08 MED ORDER — CETIRIZINE HCL 10 MG PO TABS: 10.0000 mg | ORAL_TABLET | Freq: Every day | ORAL | Status: DC

## 2014-07-08 MED ORDER — BECLOMETHASONE DIPROPIONATE 40 MCG/ACT IN AERS: 1.0000 | INHALATION_SPRAY | Freq: Two times a day (BID) | RESPIRATORY_TRACT | Status: DC

## 2014-07-08 MED ORDER — IPRATROPIUM BROMIDE 0.02 % IN SOLN
0.5000 mg | Freq: Once | RESPIRATORY_TRACT | Status: AC
  Administered 2014-07-08: 0.5 mg via RESPIRATORY_TRACT

## 2014-07-08 NOTE — Assessment & Plan Note (Addendum)
Patient has continued to have issues with chest congestion. They have not improved at all since her last visit. Suspect this is likely related to her continued smoking and under treated asthma, though with her oxygen saturation of 87% that did not appreciably improve with duoneb this brings other diagnoses in to question. Potentially could be related to a congestive HF given orthopnea and PND. Consider PNA (less likely given no rales on exam and afebrile), worsening asthma, OHS, OSA. Given persistent desaturation and stable appearance the patient was wheeled to the ED for further evaluation. I had a long discussion with her regarding the need to use her qvar every day as a controller medication and that she needs to quit smoking. Will additionally add zyrtec for potential allergic rhinitis component. Patient will need to follow-up in our clinic in the next week if she is discharged home from the ED.   Precepted with Dr Randolm IdolFletke

## 2014-07-08 NOTE — ED Notes (Signed)
Pt states she's been sick "for a while". Has been SOb at night when lying down to sleep. Hx of asthma. VSS.

## 2014-07-08 NOTE — Progress Notes (Signed)
Patient ID: Tonya Gill, female   DOB: 05/27/1976, 38 y.o.   MRN: [id number]  Marikay AlarEric Clyde Zarrella, MD Phone: [phone]  Tonya Gill is a 38 y.o. female who presents today for f/u.  Chest congestion: patient reports this is exactly the same as her last visit. She notes congestion in her chest and nose. She is coughing up yellow sputum intermittently. She notes some dyspnea in the morning after waking up, though none otherwise. She has been using her albuterol once a day and states this is just for my breathing, though can not elaborate as to the symptoms she takes this for. She is not using her qvar. States she has not used this in at least a month. Notes no wheezing. She still smokes each day and has a pack of cigarettes in the office with her. Denies fevers and chest pain. Endorses orthopnea and PND.  Right deltoid pain: unsure how long this has been present. Feels like a needle is stuck in her muscle. She gets risperdone shots in her deltoid every 2 weeks, last shot was last Friday. Not sure what makes it worse. Not sure what makes it better. She has not tried any medications for this.   Patient is a smoker.   ROS: Per HPI   Physical Exam Filed Vitals:   07/08/14 1111  BP: 129/94  Pulse: 106  Temp: 97.9 F (36.6 C)  O2 sat RA 87% Ambulatory O2 sat 87% Post duoneb O2 sat 88%  Gen: NAD HEENT: PERRL,  MMM, normal TM bilaterally, no OP erythema Lungs: no wheezing or rales Nl WOB, post-duoneb minimal wheezing at left upper lung field Heart: RRR no MRG MSK: no erythema or swelling, no breaks in the skin of the right deltoid, no tenderness to palpation of bilateral shoulder structures, 5/5 strength in UE bilaterally, normal sensation to light touch in UE, negative speeds and empty can testing, full ROM bilateral Shoulders and elbows Exts: Non edematous BL  LE, warm and well perfused.    Assessment/Plan: Please see individual problem list.  # Healthcare maintenance: refused pap  smear  Marikay AlarEric Davine Coba, MD [facility] PG[facility]

## 2014-07-08 NOTE — ED Provider Notes (Signed)
CSN: [id number]     Arrival date & time 07/08/14  1238 History   First MD Initiated Contact with Patient 07/08/14 1434     Chief Complaint  Patient presents with  . Shortness of Breath     (Consider location/radiation/quality/duration/timing/severity/associated sxs/prior Treatment) HPI Tonya Gill is a 38 y.o. female who presents emergency department from family practice clinic with complaint of hypoxia and shortness of breath. Patient states she has been short of breath for "years." She states most recently she has had increased congestion and cough. She states that her shortness of breath is worse at night when she's laying down. She states she sleeps on the floor and unable to prop herself up. She states that she is also feeling weak. She denies any chest pain or back pain. No fever or chills. She does admit to cough and nasal congestion. No other complaints. She apparently has been noncompliant with her medications. I spoke with her sister who states that she's not sure if patient is taking her psychiatric or medical medications. Patient does have history and admits to a frequent alcohol use and smoking everyday. Patient received a DuoNeb at the clinic and was sent here and do to oxygen saturation of 87%. Patient is a poor historian and keeps saying whenever asked any question "I don't know, I am not a Dr."  Past Medical History  Diagnosis Date  . Alcohol abuse   . Bipolar 1 disorder   . Paranoid schizophrenia    History reviewed. No pertinent past surgical history. History reviewed. No pertinent family history. History  Substance Use Topics  . Smoking status: Current Every Day Smoker -- 1.00 packs/day    Types: Cigarettes  . Smokeless tobacco: Never Used     Comment: started smoking as a child; current smoking 15-25cig/day  . Alcohol Use: Yes     Comment: 12 beers daily   OB History   Grav Para Term Preterm Abortions TAB SAB Ect Mult Living                 Review of  Systems  Constitutional: Negative for fever and chills.  HENT: Positive for congestion.   Respiratory: Positive for cough and shortness of breath. Negative for chest tightness.   Cardiovascular: Negative for chest pain, palpitations and leg swelling.  Gastrointestinal: Negative for nausea, vomiting, abdominal pain and diarrhea.  Genitourinary: Negative for dysuria, flank pain, vaginal bleeding, vaginal discharge, vaginal pain and pelvic pain.  Musculoskeletal: Negative for arthralgias, myalgias, neck pain and neck stiffness.  Skin: Negative for rash.  Neurological: Negative for dizziness, weakness and headaches.  All other systems reviewed and are negative.     Allergies  Review of patient's allergies indicates no known allergies.  Home Medications   Prior to Admission medications   Medication Sig Start Date End Date Taking? Authorizing Provider  albuterol (PROAIR HFA) 108 (90 BASE) MCG/ACT inhaler inhale 1 puff every 4 hours if needed for wheezing or shortness of breath 04/15/14   Glori Luis, MD  beclomethasone (QVAR) 40 MCG/ACT inhaler Inhale 1 puff into the lungs 2 (two) times daily. 07/08/14   Glori Luis, MD  benztropine (COGENTIN) 1 MG tablet Take 1 mg by mouth at bedtime.    Historical Provider, MD  cetirizine (ZYRTEC) 10 MG tablet Take 1 tablet (10 mg total) by mouth daily. 07/08/14   Glori Luis, MD  guaiFENesin (MUCINEX) 600 MG 12 hr tablet Take 2 tablets (1,200 mg total) by mouth 2 (two)  times daily as needed. 04/15/14   Glori LuisEric G Sonnenberg, MD  hydrOXYzine (ATARAX/VISTARIL) 25 MG tablet Take 25 mg by mouth at bedtime.     Historical Provider, MD  LORazepam (ATIVAN) 0.5 MG tablet Take 0.5 mg by mouth at bedtime.      Historical Provider, MD  nicotine (NICODERM CQ - DOSED IN MG/24 HOURS) 14 mg/24hr patch Place 1 patch (14 mg total) onto the skin daily. 04/15/14   Glori LuisEric G Sonnenberg, MD  risperiDONE (RISPERDAL) 1 MG tablet Take 1 mg by mouth 2 (two) times daily.     Historical Provider, MD  risperiDONE microspheres (RISPERDAL CONSTA) 37.5 MG injection Inject 37.5 mg into the muscle every 14 (fourteen) days.    Historical Provider, MD   BP 128/77  Pulse 85  Temp(Src) 98 F (36.7 C)  Resp 13  SpO2 93%  LMP 06/16/2014 Physical Exam  Nursing note and vitals reviewed. Constitutional: She is oriented to person, place, and time. She appears well-developed and well-nourished. No distress.   Morbidly obese  HENT:  Head: Normocephalic.  Eyes: Conjunctivae are normal.  Neck: Neck supple.  Cardiovascular: Normal rate, regular rhythm and normal heart sounds.   Pulmonary/Chest: Effort normal and breath sounds normal. No respiratory distress. She has no wheezes. She has no rales.  Abdominal: Soft. Bowel sounds are normal. She exhibits no distension. There is no tenderness. There is no rebound.  Musculoskeletal: She exhibits no edema.  Neurological: She is alert and oriented to person, place, and time. No cranial nerve deficit. Coordination normal.  Skin: Skin is warm and dry.  Psychiatric:  Flat affect    ED Course  Procedures (including critical care time) Labs Review Labs Reviewed  CBC - Abnormal; Notable for the following:    RBC 6.10 (*)    Hemoglobin 18.9 (*)    HCT 57.9 (*)    RDW 17.7 (*)    All other components within normal limits  COMPREHENSIVE METABOLIC PANEL - Abnormal; Notable for the following:    Glucose, Bld 102 (*)    Albumin 3.2 (*)    All other components within normal limits  PRO B NATRIURETIC PEPTIDE - Abnormal; Notable for the following:    Pro B Natriuretic peptide (BNP) 174.3 (*)    All other components within normal limits  I-STAT TROPOININ, ED    Imaging Review Dg Chest 2 View  07/08/2014   CLINICAL DATA:  Progressive shortness of breath for several days; cough  EXAM: CHEST  2 VIEW  COMPARISON:  January 03, 2008  FINDINGS: There is no edema or consolidation. Heart is upper normal in size with pulmonary vascularity within  normal limits. No adenopathy. No bone lesions.  IMPRESSION: No edema or consolidation.   Electronically Signed   By: Bretta BangWilliam  Woodruff M.D.   On: 07/08/2014 14:35     Date: 07/08/2014  Rate: 92  Rhythm: normal sinus rhythm  QRS Axis: right  Intervals: normal  ST/T Wave abnormalities: normal  Conduction Disutrbances:none  Narrative Interpretation:   Old EKG Reviewed: none available    MDM   Final diagnoses:  Hypoxia  Shortness of breath    Patient with shortness of breath apparently hypoxia while at the family practice clinic. Here she is satting around 90 on room air. By the time I saw patient, all of the lab work and chest x-ray has returned. Everything appears to be unremarkable except for elevated hemoglobin of 18.9, which appears to be chronic. Patient's lung exam shows diminished lung sounds, no  wheezing or rales. Will try another neb treatment here. Patient refused steroids. We will then ambulate patient with pulse ox to see if she can maintain her oxygen saturation.  4:14 PM Pt ambulated, oxygen sat remaining at 93%. Pt was also found smoking in the bathroom. D dimer pending.   4:44 PM D-dimer is negative. Patient continues to maintain oxygen saturation above 93%. I discussed patient with family practice on call resident, who agrees with the plan of discharging patient home with outpatient followup. She was set up an appointment next Wednesday. I discussed the treatment plan with patient who was understanding and agreed to the plan. Patient walked out of the room without waiting for discharge instructions and her papers.  Filed Vitals:   07/08/14 1507 07/08/14 1515 07/08/14 1530 07/08/14 1545  BP: 128/77 132/78 124/74 139/83  Pulse: 85 86 79 93  Temp:      Resp: 13 13 11 15   SpO2: 93% 92% 98%      Bush Murdoch A Taevon Aschoff, PA-C 07/08/14 1645

## 2014-07-08 NOTE — ED Notes (Signed)
Pt in NAD on discharge. Pt ambulatory on discharge.Pt was not in room for discharge. Discharge instructions not given to pt.
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
## 2014-07-08 NOTE — Patient Instructions (Addendum)
Nice to see you. You need to use your Qvar every day. Please get this from the pharmacy. You have to ask for refills of this. You have one year of refills on this.  YOU NEED TO QUIT SMOKING. This could be contributing to your chest congestion. Please start using the patches previously prescribed.  Please go get the chest x-ray as well.  Please start taking zyrtec each day.

## 2014-07-08 NOTE — ED Notes (Signed)
Tatyana, PA at bedside. 

## 2014-07-08 NOTE — Assessment & Plan Note (Signed)
No obvious abnormalities on exam. Potentially soft tissue injury though doubt this given lack of abnormalities. Doubt rotator cuff pathology given negative exam. Discussed using ibuprofen for discomfort. F/u prn.

## 2014-07-08 NOTE — ED Notes (Signed)
Pt ambulated and O2 sats >93%

## 2014-07-10 NOTE — ED Provider Notes (Signed)
Medical screening examination/treatment/procedure(s) were performed by non-physician practitioner and as supervising physician I was immediately available for consultation/collaboration.   EKG Interpretation   Date/Time:  Thursday July 08 2014 12:56:41 EDT Ventricular Rate:  92 PR Interval:  158 QRS Duration: 78 QT Interval:  366 QTC Calculation: 452 R Axis:   126 Text Interpretation:  Normal sinus rhythm Right axis deviation Abnormal  ECG ED PHYSICIAN INTERPRETATION AVAILABLE IN [facility] Confirmed by  TEST, Record ([id number]) on 07/10/2014 10:01:26 AM       Juliet RudeNathan R. Rubin PayorPickering, MD 07/10/14 1622

## 2014-07-14 ENCOUNTER — Other Ambulatory Visit: Payer: Self-pay | Admitting: Student-PharmD

## 2014-07-14 ENCOUNTER — Ambulatory Visit (INDEPENDENT_AMBULATORY_CARE_PROVIDER_SITE_OTHER): Payer: Medicare Other | Admitting: Family Medicine

## 2014-07-14 ENCOUNTER — Encounter: Payer: Self-pay | Admitting: Family Medicine

## 2014-07-14 VITALS — BP 176/95 | HR 96 | Temp 98.7°F | Ht 67.0 in | Wt 318.0 lb

## 2014-07-14 DIAGNOSIS — R0902 Hypoxemia: Secondary | ICD-10-CM

## 2014-07-14 NOTE — Progress Notes (Signed)
Tonya Gill is a 38 y.o. female who presents today for ED f/u from one week ago.  F/U for Dyspnea - Pt was seen in clinic about one week ago for ongoing shortness of breath.  At that time, there was concern for underlying cardiovascular vs pulmonary condition and was sent to the ED for w/u.  She was r/o for PE and ACS and was determined to most likely be from underlying COPD/Asthma/Obesity-hypoventilation syndrome/OSA.  Since her d/c, she has felt the same and denies any increasing dyspnea on exertion or at rest.  She has not been smoking for the past 5 days due to her SOB and has been using the pro-air PRN (about 2-3 x per day).  Does not know if she is using Qvar or if she has had previous PFT's performed.  Denies HA, chest pain, numbness down her arms  Past Medical History  Diagnosis Date  . Alcohol abuse   . Bipolar 1 disorder   . Paranoid schizophrenia     History  Smoking status  . Current Every Day Smoker -- 1.00 packs/day  . Types: Cigarettes  Smokeless tobacco  . Never Used    Comment: started smoking as a child; current smoking 15-25cig/day    No family history on file.  Current Outpatient Prescriptions on File Prior to Visit  Medication Sig Dispense Refill  . albuterol (PROAIR HFA) 108 (90 BASE) MCG/ACT inhaler inhale 1 puff every 4 hours if needed for wheezing or shortness of breath  8.5 g  1  . albuterol (PROVENTIL HFA;VENTOLIN HFA) 108 (90 BASE) MCG/ACT inhaler Inhale 1 puff into the lungs every 4 (four) hours as needed for shortness of breath.      . beclomethasone (QVAR) 40 MCG/ACT inhaler Inhale 1 puff into the lungs 2 (two) times daily.  1 Inhaler  12  . benztropine (COGENTIN) 1 MG tablet Take 1 mg by mouth at bedtime.      . cetirizine (ZYRTEC) 10 MG tablet Take 1 tablet (10 mg total) by mouth daily.  30 tablet  11  . guaiFENesin (MUCINEX) 600 MG 12 hr tablet Take 2 tablets (1,200 mg total) by mouth 2 (two) times daily as needed.  30 tablet  0  . hydrOXYzine  (ATARAX/VISTARIL) 25 MG tablet Take 25 mg by mouth at bedtime.       Marland Kitchen. LORazepam (ATIVAN) 0.5 MG tablet Take 0.5 mg by mouth at bedtime.        . nicotine (NICODERM CQ - DOSED IN MG/24 HOURS) 14 mg/24hr patch Place 1 patch (14 mg total) onto the skin daily.  28 patch  0  . risperiDONE (RISPERDAL) 1 MG tablet Take 1 mg by mouth 2 (two) times daily.      . risperiDONE microspheres (RISPERDAL CONSTA) 37.5 MG injection Inject 37.5 mg into the muscle every 14 (fourteen) days.       No current facility-administered medications on file prior to visit.    ROS: Per HPI.  All other systems reviewed and are negative.   Physical Exam Filed Vitals:   07/14/14 1432  BP: 176/95  Pulse: 96  Temp: 98.7 F (37.1 C)    Physical Examination: General appearance - alert, well appearing, and in no distress Neck - no accessory muscle use  Chest - No increased WOB, normal rate of breathing  (18 bpm), diffuse expiratory wheezes B/L  Heart - normal rate and regular rhythm    Chemistry      Component Value Date/Time  NA 141 07/08/2014 1305   K 5.0 07/08/2014 1305   CL 99 07/08/2014 1305   CO2 32 07/08/2014 1305   BUN 10 07/08/2014 1305   CREATININE 0.75 07/08/2014 1305      Component Value Date/Time   CALCIUM 9.2 07/08/2014 1305   ALKPHOS 116 07/08/2014 1305   AST 18 07/08/2014 1305   ALT 16 07/08/2014 1305   BILITOT 0.4 07/08/2014 1305      Lab Results  Component Value Date   WBC 9.4 07/08/2014   HGB 18.9* 07/08/2014   HCT 57.9* 07/08/2014   MCV 94.9 07/08/2014   PLT 170 07/08/2014

## 2014-07-14 NOTE — Patient Instructions (Signed)
PLEASE SCHEDULE TO SEE DR. KOVAL FOR PULMONARY FUNCTION TESTING AND INHALER TEACHING.  Thanks, Dr. Paulina FusiHess

## 2014-07-14 NOTE — Assessment & Plan Note (Addendum)
Underlying hypoxemia 2/2 most likely COPD and obesity hypoventilation syndrome.  She has trouble with compliance 2/2 level of education.  No current CP and unchanged from previous when she had w/u for cardiovascular source and pulmonary source about one week ago.  - Referral to Dr. Raymondo BandKoval for two fold.  PFT testing and COPD/Asthma teaching as she most likely will qualify for Steroid/LABA combination.  - F/U with PCP in 1-2 weeks for recheck - At this point, would highly consider referral sleep study for possible OSA - As well, she most likely will qualify for O2, but would perform the ambulation qualification after sure dx of COPD.  - Smoking cessation and weight loss will be the two biggest keys for improving her condition however.

## 2014-07-29 ENCOUNTER — Encounter: Payer: Self-pay | Admitting: Pharmacist

## 2014-07-29 ENCOUNTER — Ambulatory Visit (INDEPENDENT_AMBULATORY_CARE_PROVIDER_SITE_OTHER): Payer: Medicare Other | Admitting: Pharmacist

## 2014-07-29 VITALS — BP 107/73 | HR 111 | Ht 67.0 in | Wt 312.0 lb

## 2014-07-29 DIAGNOSIS — IMO0002 Reserved for concepts with insufficient information to code with codable children: Secondary | ICD-10-CM

## 2014-07-29 DIAGNOSIS — F17200 Nicotine dependence, unspecified, uncomplicated: Secondary | ICD-10-CM

## 2014-07-29 DIAGNOSIS — J45998 Other asthma: Secondary | ICD-10-CM

## 2014-07-29 DIAGNOSIS — Z720 Tobacco use: Secondary | ICD-10-CM

## 2014-07-29 MED ORDER — ALBUTEROL SULFATE HFA 108 (90 BASE) MCG/ACT IN AERS: 1.0000 | INHALATION_SPRAY | RESPIRATORY_TRACT | Status: DC | PRN

## 2014-07-29 MED ORDER — NICOTINE 14 MG/24HR TD PT24: 14.0000 mg | MEDICATED_PATCH | Freq: Every day | TRANSDERMAL | Status: DC

## 2014-07-29 MED ORDER — BECLOMETHASONE DIPROPIONATE 40 MCG/ACT IN AERS: 1.0000 | INHALATION_SPRAY | Freq: Two times a day (BID) | RESPIRATORY_TRACT | Status: DC
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
## 2014-07-29 NOTE — Patient Instructions (Addendum)
Thanks for coming in today. It was great seeing you. New prescriptions have been sent to your pharmacy. Continue taking Qvar 1 puff 2 times a day. Use the albuterol inhaler for any shortness of breath. A new order for the nicotine patches have been sent to your pharmacy. Follow up with us in 2 weeks in pharmacy clinic.
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
## 2014-07-30 NOTE — Progress Notes (Signed)
Patient ID: Tonya BlakesJennifer L Carreon, female   DOB: Nov 14, 1975, 38 y.o.   MRN: [id number] Reviewed: Agree with Dr. Macky LowerKoval's documentation and management.

## 2014-08-12 ENCOUNTER — Ambulatory Visit: Payer: Medicare Other | Admitting: Pharmacist

## 2014-11-02 ENCOUNTER — Encounter: Payer: Self-pay | Admitting: Family Medicine

## 2014-11-02 ENCOUNTER — Ambulatory Visit (INDEPENDENT_AMBULATORY_CARE_PROVIDER_SITE_OTHER): Payer: Medicare Other | Admitting: Family Medicine

## 2014-11-02 VITALS — BP 123/85 | HR 90 | Temp 98.5°F | Ht 67.0 in | Wt 301.0 lb

## 2014-11-02 DIAGNOSIS — E668 Other obesity: Secondary | ICD-10-CM

## 2014-11-02 DIAGNOSIS — N393 Stress incontinence (female) (male): Secondary | ICD-10-CM

## 2014-11-02 DIAGNOSIS — J984 Other disorders of lung: Secondary | ICD-10-CM

## 2014-11-02 DIAGNOSIS — E669 Obesity, unspecified: Secondary | ICD-10-CM

## 2014-11-02 DIAGNOSIS — F259 Schizoaffective disorder, unspecified: Secondary | ICD-10-CM

## 2014-11-02 DIAGNOSIS — J329 Chronic sinusitis, unspecified: Secondary | ICD-10-CM

## 2014-11-02 MED ORDER — FLUTICASONE PROPIONATE 50 MCG/ACT NA SUSP: 2.0000 | Freq: Every day | NASAL | Status: DC

## 2014-11-02 MED ORDER — BECLOMETHASONE DIPROPIONATE 40 MCG/ACT IN AERS: 1.0000 | INHALATION_SPRAY | Freq: Two times a day (BID) | RESPIRATORY_TRACT | Status: DC

## 2014-11-02 MED ORDER — CETIRIZINE HCL 10 MG PO TABS: 10.0000 mg | ORAL_TABLET | Freq: Every day | ORAL | Status: DC

## 2014-11-02 MED ORDER — ALBUTEROL SULFATE HFA 108 (90 BASE) MCG/ACT IN AERS: 1.0000 | INHALATION_SPRAY | RESPIRATORY_TRACT | Status: DC | PRN

## 2014-11-02 MED ORDER — AMOXICILLIN-POT CLAVULANATE 875-125 MG PO TABS: 1.0000 | ORAL_TABLET | Freq: Two times a day (BID) | ORAL | Status: DC

## 2014-11-02 NOTE — Progress Notes (Signed)
Patient ID: Tonya Gill, female   DOB: [birth year], 39 y.o.   MRN: [id number]  Marikay AlarEric Denell Cothern, MD Phone: [phone]  Tonya Gill is a 39 y.o. female who presents today for f/u. History limited by patients schizoaffective disorder.   Cough: patient reports has had cough the whole winter. She notes it has been present since prior to her last office visit in October. She notes intermittent productive cough. Is not using her qvar. Is only using her albuterol if she can't breath, though she is unable to tell me how often that is. She does not persistent sinus congestion, post nasal drip, and rhinorrhea. She notes some ear fullness as well. She denies fevers and dyspnea. She repeatedly requests antibiotics and states that she only wants antibiotics and does not want to do anything else during today's visit.  Stress incontinence: patient reports small leakage of urine when she coughs. No urgency, frequency, or dysuria. No other urinary complaints. No abdominal pain.  Medication monitoring: patient is on several psych medications. Most notably risperdone. Needs monitoring labs and EKG.  Throughout the office visit the patient repeatedly stated she did not want to be here. She stated "they done did this s**t without my permission" and "I didn't schedule this. I didn't want to be here." She states her mental health agency scheduled this appointment. Her mental health nurse was present with her.   Patient is a smoker.   ROS: Per HPI   Physical Exam Filed Vitals:   11/02/14 0839  BP: 123/85  Pulse: 90  Temp: 98.5 F (36.9 C)    Gen: Well NAD HEENT: PERRL,  MMM, bilateral TMs normal, mild erythema of posterior OP no exudate, no cervical LAD Lungs: CTABL Nl WOB Heart: RRR  Exts: Non edematous BL  LE, warm and well perfused.    Assessment/Plan: Please see individual problem list.  Marikay AlarEric Jeanne Diefendorf, MD [facility] PGY-3

## 2014-11-02 NOTE — Patient Instructions (Addendum)
Nice to see you. We will treat you with an antibiotic for chronic sinusitis.  Please start using the flonase and zyrtec.  You should use the qvar every day no matter what. You need to use the albuterol as needed. If you develop shortness of breath, chest pain, fever, worsening cough, chills, nausea, vomiting, or diarrhea please seek medical attention.   Kegel Exercises The goal of Kegel exercises is to isolate and exercise your pelvic floor muscles. These muscles act as a hammock that supports the rectum, vagina, small intestine, and uterus. As the muscles weaken, the hammock sags and these organs are displaced from their normal positions. Kegel exercises can strengthen your pelvic floor muscles and help you to improve bladder and bowel control, improve sexual response, and help reduce many problems and some discomfort during pregnancy. Kegel exercises can be done anywhere and at any time. HOW TO PERFORM KEGEL EXERCISES 1. Locate your pelvic floor muscles. To do this, squeeze (contract) the muscles that you use when you try to stop the flow of urine. You will feel a tightness in the vaginal area (women) and a tight lift in the rectal area (men and women). 2. When you begin, contract your pelvic muscles tight for 2-5 seconds, then relax them for 2-5 seconds. This is one set. Do 4-5 sets with a short pause in between. 3. Contract your pelvic muscles for 8-10 seconds, then relax them for 8-10 seconds. Do 4-5 sets. If you cannot contract your pelvic muscles for 8-10 seconds, try 5-7 seconds and work your way up to 8-10 seconds. Your goal is 4-5 sets of 10 contractions each day. Keep your stomach, buttocks, and legs relaxed during the exercises. Perform sets of both short and long contractions. Vary your positions. Perform these contractions 3-4 times per day. Perform sets while you are:   Lying in bed in the morning.  Standing at lunch.  Sitting in the late afternoon.  Lying in bed at night. You  should do 40-50 contractions per day. Do not perform more Kegel exercises per day than recommended. Overexercising can cause muscle fatigue. Continue these exercises for for at least 15-20 weeks or as directed by your caregiver. Document Released: 09/03/2012 Document Reviewed: 09/03/2012 [facility] Patient Information 2015 [place] [place]. This information is not intended to replace advice given to you by your health care provider. Make sure you discuss any questions you have with your health care provider.

## 2014-11-03 DIAGNOSIS — N393 Stress incontinence (female) (male): Secondary | ICD-10-CM | POA: Insufficient documentation

## 2014-11-03 DIAGNOSIS — J329 Chronic sinusitis, unspecified: Secondary | ICD-10-CM | POA: Insufficient documentation

## 2014-11-03 DIAGNOSIS — E669 Obesity, unspecified: Secondary | ICD-10-CM

## 2014-11-03 DIAGNOSIS — J984 Other disorders of lung: Secondary | ICD-10-CM | POA: Insufficient documentation

## 2014-11-03 NOTE — Assessment & Plan Note (Signed)
Patient needs EKG and labs today. She refused all work up at this time. Will need to attempt at later follow-up.

## 2014-11-03 NOTE — Assessment & Plan Note (Signed)
Patient with symptoms of stress incontinence. She declined further work-up to look for other source of this issue. Will mail copy of kegel exercises to the patient to help with this issue.

## 2014-11-03 NOTE — Assessment & Plan Note (Signed)
Patient with symptoms of chronic sinusitis and now with cough likely related to post nasal drip, though could also be related to her asthma. Given length of symptoms will treat with augmentin, flonase, saline rinses, and zyrtec. Discussed obtaining CXR given her cough though patient declined this. Her lung exam is normal making PNA an unlikely cause of her cough. Discussed that she needs to use her qvar daily for her asthma. Given return precautions.   Precepted with Dr McDiarmid.

## 2014-11-03 NOTE — Assessment & Plan Note (Signed)
Patient with moderate restrictive lung disease on PFTs in October. This likely accounts for the patients O2 sat of 85% in the office. I discussed CXR with the patient to evaluate for other causes and potential other therapies to help with O2, though she declined these. She is not short of breath. I discussed with Dr McDiarmid the possibility of starting oxygen on the patient with a pickwickian syndrome and it was felt that given her continued smoking this could do more harm than good. Given return precautions.

## 2015-02-25 ENCOUNTER — Other Ambulatory Visit: Payer: Self-pay | Admitting: Family Medicine

## 2015-04-25 ENCOUNTER — Other Ambulatory Visit: Payer: Self-pay | Admitting: Family Medicine

## 2015-04-29 NOTE — Telephone Encounter (Signed)
Please advise refill as Dr. Sonnenberg is not in that office anymore.  

## 2015-05-17 ENCOUNTER — Other Ambulatory Visit: Payer: Self-pay | Admitting: Family Medicine

## 2015-05-24 ENCOUNTER — Other Ambulatory Visit: Payer: Self-pay | Admitting: Family Medicine

## 2015-08-04 ENCOUNTER — Other Ambulatory Visit: Payer: Self-pay | Admitting: Family Medicine

## 2015-08-05 NOTE — Telephone Encounter (Signed)
Mailed letter to pt today stating to contact office for FU appt. Cobain Morici, CMA.

## 2015-08-05 NOTE — Telephone Encounter (Signed)
Rx filled.  Katina Degreealeb M. Jimmey RalphParker, MD [facility] Resident PGY-2 08/05/2015 4:56 PM

## 2015-10-11 ENCOUNTER — Other Ambulatory Visit: Payer: Self-pay | Admitting: Family Medicine

## 2016-03-21 ENCOUNTER — Ambulatory Visit (INDEPENDENT_AMBULATORY_CARE_PROVIDER_SITE_OTHER): Payer: Medicare Other | Admitting: Family Medicine

## 2016-03-21 ENCOUNTER — Encounter: Payer: Self-pay | Admitting: Family Medicine

## 2016-03-21 VITALS — BP 125/76 | HR 91 | Temp 98.4°F | Ht 67.0 in | Wt 296.0 lb

## 2016-03-21 DIAGNOSIS — R05 Cough: Secondary | ICD-10-CM | POA: Diagnosis present

## 2016-03-21 DIAGNOSIS — R059 Cough, unspecified: Secondary | ICD-10-CM

## 2016-03-21 MED ORDER — FLUTICASONE PROPIONATE 50 MCG/ACT NA SUSP: 2.0000 | Freq: Every day | NASAL | Status: DC

## 2016-03-21 MED ORDER — BECLOMETHASONE DIPROPIONATE 40 MCG/ACT IN AERS
1.0000 | INHALATION_SPRAY | Freq: Two times a day (BID) | RESPIRATORY_TRACT | Status: DC
Start: 2016-03-21 — End: 2017-05-13

## 2016-03-21 MED ORDER — ALBUTEROL SULFATE HFA 108 (90 BASE) MCG/ACT IN AERS: INHALATION_SPRAY | RESPIRATORY_TRACT | Status: DC

## 2016-03-21 NOTE — Patient Instructions (Signed)
You have inflammation in your nose. This is causing your cough and congestion.  We will prescribe flonase which will help clear this up. I will also send in your asthma inhalers.  If your symptoms are not improving in 1-2 weeks, let us know.   Please come back to see me in a few months for a regular check up, or sooner if you need anything else.   Take care,  Dr Jimmey RalphParker

## 2016-03-21 NOTE — Progress Notes (Signed)
    Subjective:  Tonya Gill is a 40 y.o. female who [facility] with a chief complaint of [facility].   HPI:  Cough Has had issues with chronic cough on and off for the last 2 years. Has a history of asthma but says that this feels different. Does not feel short of breath. No chest pain. Has tried using albuterol which does not help. No fevers or chills. Has noticed increased sinus pressure and some rhinorrhea. Cough is productive of clear sputum. No known sick contacts. Has not been taking flonase, zyrtec, qvar, or albuterol.   ROS: Per HPI   Objective:  Physical Exam: BP 125/76 mmHg  Pulse 91  Temp(Src) 98.4 F (36.9 C) (Oral)  Ht 5\' 7"  (1.702 m)  Wt 296 lb (134.265 kg)  BMI 46.35 kg/m2  SpO2 89%  LMP 03/15/2016 (Exact Date)  Gen: NAD, resting comfortably HEENT: TMs clear bilaterally. OP clear. Nasal turbinates erythematous and boggy CV: RRR with no murmurs appreciated Pulm: NWOB, CTAB with no crackles, wheezes, or rhonchi MSK: no edema, cyanosis, or clubbing noted Skin: warm, dry Neuro: grossly normal, moves all extremities Psych: Avoids eye contact. Perseverative on "substance in chest."  Assessment/Plan:  Cough Likely multifactorial in setting of allergic rhinitis, asthma, and tobacco abuse. Will refill flonase, albuterol, and qvar as patient has been out of these medications. No signs of bacterial infection.   O2 sat at baseline - hypoxemia likely combination of asthma and OHS.  If symptoms not improving with above, consider PPI trial and possibly imaging. Return precautions reviewed.   Katina Degreealeb M. Jimmey RalphParker, MD [facility] Resident PGY-2 03/21/2016 3:33 PM

## 2016-03-22 ENCOUNTER — Telehealth: Payer: Self-pay | Admitting: *Deleted

## 2016-03-22 NOTE — Telephone Encounter (Signed)
PA faxed to [employer] for review.  Review process could take 24-72 hours to complete.  Clovis PuMartin, Tamika L, RN

## 2016-03-22 NOTE — Telephone Encounter (Signed)
Form completed and given to Tamika.  Katina Degreealeb M. Jimmey RalphParker, MD [facility] Resident PGY-2 03/22/2016 11:12 AM

## 2016-03-22 NOTE — Telephone Encounter (Signed)
Prior Authorization received from [facility] for Qvar.  PA form placed in provider box for completion. Clovis PuMartin, Melik Blancett L, RN

## 2016-03-22 NOTE — Telephone Encounter (Signed)
PA was approved via [employer] valid through 12/23/2015-03/22/2017.  Clovis PuMartin, Ashaad Gaertner L, RN

## 2016-05-02 ENCOUNTER — Ambulatory Visit (INDEPENDENT_AMBULATORY_CARE_PROVIDER_SITE_OTHER): Payer: Medicare Other | Admitting: Family Medicine

## 2016-05-02 ENCOUNTER — Encounter: Payer: Self-pay | Admitting: Family Medicine

## 2016-05-02 ENCOUNTER — Other Ambulatory Visit (HOSPITAL_COMMUNITY)
Admission: RE | Admit: 2016-05-02 | Discharge: 2016-05-02 | Disposition: A | Discharge status: Home / Self Care | Payer: Medicare Other | Source: Ambulatory Visit | Attending: Family Medicine | Admitting: Family Medicine

## 2016-05-02 VITALS — BP 91/68 | HR 102 | Temp 97.7°F | Wt 306.0 lb

## 2016-05-02 DIAGNOSIS — R739 Hyperglycemia, unspecified: Secondary | ICD-10-CM

## 2016-05-02 DIAGNOSIS — Z124 Encounter for screening for malignant neoplasm of cervix: Secondary | ICD-10-CM | POA: Diagnosis not present

## 2016-05-02 DIAGNOSIS — Z0001 Encounter for general adult medical examination with abnormal findings: Secondary | ICD-10-CM | POA: Diagnosis present

## 2016-05-02 DIAGNOSIS — Z01419 Encounter for gynecological examination (general) (routine) without abnormal findings: Secondary | ICD-10-CM | POA: Diagnosis present

## 2016-05-02 DIAGNOSIS — R6889 Other general symptoms and signs: Secondary | ICD-10-CM | POA: Diagnosis not present

## 2016-05-02 DIAGNOSIS — E669 Obesity, unspecified: Secondary | ICD-10-CM

## 2016-05-02 DIAGNOSIS — Z3042 Encounter for surveillance of injectable contraceptive: Secondary | ICD-10-CM

## 2016-05-02 DIAGNOSIS — Z3049 Encounter for surveillance of other contraceptives: Secondary | ICD-10-CM

## 2016-05-02 DIAGNOSIS — Z114 Encounter for screening for human immunodeficiency virus [HIV]: Secondary | ICD-10-CM | POA: Diagnosis not present

## 2016-05-02 DIAGNOSIS — Z1151 Encounter for screening for human papillomavirus (HPV): Secondary | ICD-10-CM | POA: Insufficient documentation

## 2016-05-02 DIAGNOSIS — Z202 Contact with and (suspected) exposure to infections with a predominantly sexual mode of transmission: Secondary | ICD-10-CM

## 2016-05-02 DIAGNOSIS — Z720 Tobacco use: Secondary | ICD-10-CM | POA: Diagnosis not present

## 2016-05-02 LAB — LIPID PANEL
CHOL/HDL RATIO: 3.8 ratio (ref <=5.0)
Cholesterol: 225 mg/dL — ABNORMAL HIGH (ref 125–200)
HDL: 59 mg/dL (ref >=46)
LDL Cholesterol: 149 mg/dL — ABNORMAL HIGH (ref <130)
Triglycerides: 84 mg/dL (ref <150)
VLDL: 17 mg/dL (ref <30)

## 2016-05-02 LAB — COMPREHENSIVE METABOLIC PANEL
ALT: 14 U/L (ref 6–29)
AST: 17 U/L (ref 10–30)
Albumin: 3.8 g/dL (ref 3.6–5.1)
Alkaline Phosphatase: 107 U/L (ref 33–115)
BUN: 9 mg/dL (ref 7–25)
CHLORIDE: 98 mmol/L (ref 98–110)
CO2: 30 mmol/L (ref 20–31)
CREATININE: 0.79 mg/dL (ref 0.50–1.10)
Calcium: 9.1 mg/dL (ref 8.6–10.2)
Glucose, Bld: 92 mg/dL (ref 65–99)
POTASSIUM: 4.6 mmol/L (ref 3.5–5.3)
SODIUM: 137 mmol/L (ref 135–146)
Total Bilirubin: 0.3 mg/dL (ref 0.2–1.2)
Total Protein: 6.9 g/dL (ref 6.1–8.1)

## 2016-05-02 LAB — CBC
HCT: 55.4 % — ABNORMAL HIGH (ref 35.0–45.0)
Hemoglobin: 18.5 g/dL — ABNORMAL HIGH (ref 11.7–15.5)
MCH: 31.3 pg (ref 27.0–33.0)
MCHC: 33.4 g/dL (ref 32.0–36.0)
MCV: 93.7 fL (ref 80.0–100.0)
MPV: 10.2 fL (ref 7.5–12.5)
PLATELETS: 171 10*3/uL (ref 140–400)
RBC: 5.91 MIL/uL — ABNORMAL HIGH (ref 3.80–5.10)
RDW: 16.2 % — ABNORMAL HIGH (ref 11.0–15.0)
WBC: 9.6 10*3/uL (ref 3.8–10.8)

## 2016-05-02 LAB — POCT URINE PREGNANCY: PREG TEST UR: NEGATIVE

## 2016-05-02 LAB — POCT GLYCOSYLATED HEMOGLOBIN (HGB A1C): Hemoglobin A1C: 5.7

## 2016-05-02 MED ORDER — MEDROXYPROGESTERONE ACETATE 150 MG/ML IM SUSP
150.0000 mg | Freq: Once | INTRAMUSCULAR | Status: AC
  Administered 2016-05-02: 150 mg via INTRAMUSCULAR

## 2016-05-02 MED ORDER — NICOTINE 14 MG/24HR TD PT24: 14.0000 mg | MEDICATED_PATCH | Freq: Every day | TRANSDERMAL | 1 refills | Status: DC

## 2016-05-02 NOTE — Progress Notes (Signed)
Subjective:    Tonya Gill is a 40 y.o. female and is here for a comprehensive physical exam. The patient reports no complaints.   Diet/Exercise: Tries to walk to the [employer] park and back everyday. Has food stamps and says that she is only eating once a day.   Social History   Social History  . Marital status: Single    Spouse name: N/A  . Number of children: N/A  . Years of education: N/A   Occupational History  . Not on file.   Social History Main Topics  . Smoking status: Current Every Day Smoker    Packs/day: 1.00    Years: 30.00    Types: Cigarettes    Start date: 07/29/1981  . Smokeless tobacco: Never Used     Comment: started smoking as a child; current smoking 10cig/day  . Alcohol use 0.0 oz/week     Comment: 12 beers daily  . Drug use:     Types: Marijuana     Comment: occasionally  . Sexual activity: No   Other Topics Concern  . Not on file   Social History Narrative  . No narrative on file   Health Maintenance  Topic Date Due  . HIV Screening  03/19/1989  . PAP SMEAR  04/05/2012  . INFLUENZA VACCINE  05/01/2016  . TETANUS/TDAP  04/15/2024   The following portions of the patient's history were reviewed and updated as appropriate: allergies, current medications, past family history, past medical history, past social history, past surgical history and problem list.  Review of Systems A comprehensive review of systems was negative.   Objective:    BP 91/68   Pulse (!) 102   Temp 97.7 F (36.5 C) (Oral)   Wt (!) 306 lb (138.8 kg)   LMP 04/20/2016   SpO2 (!) 88%   BMI 47.93 kg/m  General appearance: alert, cooperative, appears stated age and no distress Head: Normocephalic, without obvious abnormality, atraumatic Eyes: negative Ears: normal TM's and external ear canals both ears Nose: Nares normal. Septum midline. Mucosa normal. No drainage or sinus tenderness. Throat: lips, mucosa, and tongue normal; teeth and gums normal Neck: no  adenopathy, no carotid bruit, no JVD, supple, symmetrical, trachea midline and thyroid not enlarged, symmetric, no tenderness/mass/nodules Back: symmetric, no curvature. ROM normal. No CVA tenderness. Lungs: wheezes bibasilar Heart: regular rate and rhythm, S1, S2 normal, no murmur, click, rub or gallop Abdomen: Obese, soft, non-tender; bowel sounds normal; no masses,  no organomegaly Pelvic: cervix normal in appearance, external genitalia normal, no adnexal masses or tenderness, no cervical motion tenderness, rectovaginal septum normal, uterus normal size, shape, and consistency and vagina normal without discharge Extremities: extremities normal, atraumatic, no cyanosis or edema Pulses: 2+ and symmetric Skin: Skin color, texture, turgor normal. No rashes or lesions Lymph nodes: Cervical, supraclavicular, and axillary nodes normal. Neurologic: Alert and oriented X 3, normal strength and tone. Normal symmetric reflexes. Normal coordination and gait    Assessment:    Healthy female exam. Obesity, Tobacco Abuse.      Plan:   Annual Exam Pap sent today. Will also check HIV and GC/CT per patient request. Discussed healthy lifestyle behaviors including striving for at least 30 minutes of vigorous activity daily. Will check lipid panel, CBC, BMP, and Hemoglobin A1c today.   Encounter for Contraceptive Management Urine pregnancy negative today. Gave depo provera per patient request.   Tobacco Abuse Encouraged cessation today. Sent in prescription for nicotine patches.   Follow up in 2-3  months for regular follow up.  Katina Degree. Jimmey Ralph, MD [facility] Resident PGY-3 05/02/2016 11:40 AM

## 2016-05-02 NOTE — Patient Instructions (Addendum)
We will be checking your pap test, cholesterol, blood counts, and blood sugar today. We will also be checking you for STDs. It will take a few days for these results to come back.  You should be taking your QVAR daily and albuterol only as needed.  Please restart your flonase.   We will send in more nicotine patches for you.  Please continue trying to exercise more and eating more healthy.   Please schedule a regular follow up appointment with me soon.   Take care,  Dr Jimmey Ralph

## 2016-05-03 LAB — HIV ANTIBODY (ROUTINE TESTING W REFLEX): HIV 1&2 Ab, 4th Generation: NONREACTIVE

## 2016-05-03 LAB — CERVICOVAGINAL ANCILLARY ONLY
Chlamydia: NEGATIVE
Neisseria Gonorrhea: NEGATIVE

## 2016-05-03 LAB — RPR

## 2016-05-03 LAB — CYTOLOGY - PAP

## 2016-05-04 ENCOUNTER — Encounter: Payer: Self-pay | Admitting: Family Medicine

## 2016-05-16 ENCOUNTER — Other Ambulatory Visit: Payer: Self-pay | Admitting: Family Medicine

## 2016-05-16 MED ORDER — ALBUTEROL SULFATE HFA 108 (90 BASE) MCG/ACT IN AERS: INHALATION_SPRAY | RESPIRATORY_TRACT | 2 refills | Status: DC

## 2016-05-16 MED ORDER — FLUTICASONE PROPIONATE 50 MCG/ACT NA SUSP: 2.0000 | Freq: Every day | NASAL | 6 refills | Status: DC

## 2016-05-16 NOTE — Telephone Encounter (Signed)
Pt would like a refill on flonase and albuterol. [facility] [address] Please advise. Thanks! ep

## 2016-07-30 ENCOUNTER — Ambulatory Visit: Payer: Medicare Other | Admitting: Family Medicine

## 2016-08-29 ENCOUNTER — Other Ambulatory Visit: Payer: Self-pay | Admitting: *Deleted

## 2016-08-29 MED ORDER — ALBUTEROL SULFATE HFA 108 (90 BASE) MCG/ACT IN AERS: INHALATION_SPRAY | RESPIRATORY_TRACT | 2 refills | Status: DC

## 2016-11-02 ENCOUNTER — Other Ambulatory Visit: Payer: Self-pay | Admitting: Family Medicine

## 2016-12-03 ENCOUNTER — Other Ambulatory Visit: Payer: Self-pay | Admitting: Family Medicine

## 2017-05-13 ENCOUNTER — Other Ambulatory Visit: Payer: Self-pay | Admitting: Family Medicine

## 2017-06-11 ENCOUNTER — Ambulatory Visit (INDEPENDENT_AMBULATORY_CARE_PROVIDER_SITE_OTHER): Payer: Medicare Other | Admitting: Family Medicine

## 2017-06-11 ENCOUNTER — Encounter: Payer: Self-pay | Admitting: Family Medicine

## 2017-06-11 VITALS — Ht 67.0 in | Wt 300.0 lb

## 2017-06-11 DIAGNOSIS — Z23 Encounter for immunization: Secondary | ICD-10-CM

## 2017-06-11 DIAGNOSIS — J4541 Moderate persistent asthma with (acute) exacerbation: Secondary | ICD-10-CM

## 2017-06-11 MED ORDER — BUDESONIDE 90 MCG/ACT IN AEPB: 2.0000 | INHALATION_SPRAY | Freq: Two times a day (BID) | RESPIRATORY_TRACT | 2 refills | Status: DC

## 2017-06-11 NOTE — Patient Instructions (Signed)
It was great seeing you today! We have addressed the following issues today  1. I am starting you on Pulmicort for your asthma instead of Qvar that is not cover by your insurance anymore. You will do two puffs two times a day. 2. You should also have refills on your Ventolin. 3. Make an appointment to see me so we can address your other medical issues since you have not been seen in clinic in a while.  If we did any lab work today, and the results require attention, either me or my nurse will get in touch with you. If everything is normal, you will get a letter in mail and a message via . If you don't hear from us in two weeks, please give us a call. Otherwise, we look forward to seeing you again at your next visit. If you have any questions or concerns before then, please call the clinic at [phone].  Please bring all your medications to every doctors visit  Sign up for My Chart to have easy access to your labs results, and communication with your Primary care physician. Please ask Front Desk for some assistance.   Please check-out at the front desk before leaving the clinic.    Take Care,   Dr. Sydnee Cabaliallo

## 2017-06-11 NOTE — Progress Notes (Signed)
   Subjective:    Patient ID: Tonya Gill, female    DOB: [birth year], 41 y.o.   MRN: [id number]   WU:[id number]:Medication refill for asthma   HPI: Patient is a 41 year old female who presented today for medication refill for her asthma. Patient reports that she recently refilled her Ventolin but was told that the Qvar will not be covered by insurance anymore. Patient reports that she has been using her albuterol inhaler as needed, and the Qvar on a daily basis. Patient denies any recent exacerbations. Patient denies any shortness of breath, chest pain, abdominal pain, dizziness, or any change in appetite.  Smoking status reviewed   ROS: all other systems were reviewed and are negative other than in the HPI   Past Medical History:  Diagnosis Date  . Alcohol abuse   . Bipolar 1 disorder (HCC)   . Paranoid schizophrenia (HCC)     No past surgical history on file.  Past medical history, surgical, family, and social history reviewed and updated in the EMR as appropriate.  Objective:  Ht 5\' 7"  (1.702 m)   Wt 300 lb (136.1 kg)   LMP 06/02/2017 (Exact Date)   BMI 46.99 kg/m   Vitals and nursing note reviewed  General: NAD, pleasant, able to participate in exam Cardiac: RRR, normal heart sounds, no murmurs. 2+ radial and PT pulses bilaterally Respiratory: CTAB, normal effort, No wheezes, rales or rhonchi Abdomen: soft, nontender, nondistended, no hepatic or splenomegaly, +BS Extremities: no edema or cyanosis. WWP. Skin: warm and dry, no rashes noted Neuro: alert and oriented x4, no focal deficits Psych: Normal affect and mood   Assessment & Plan:   #Asthma medication refill Patient presented with no acute symptoms of shortness of breath or increased work of breathing. Asthma is at baseline. She is here for alternative to Qvar which is no longer covered by insurance. --Start patient on Budesonide 90 g, 2 puffs twice a day. --Refill Ventolin --Asked patient to make an appointment to  see me in clinic for other chronic issues.    Lovena NeighboursAbdoulaye Analeigha Nauman, MD [facility] PGY-2

## 2017-08-28 ENCOUNTER — Other Ambulatory Visit: Payer: Self-pay | Admitting: Family Medicine

## 2017-08-28 DIAGNOSIS — J4541 Moderate persistent asthma with (acute) exacerbation: Secondary | ICD-10-CM

## 2017-10-17 ENCOUNTER — Other Ambulatory Visit: Payer: Self-pay | Admitting: Family Medicine

## 2017-10-25 ENCOUNTER — Other Ambulatory Visit: Payer: Self-pay | Admitting: Family Medicine

## 2017-11-21 ENCOUNTER — Other Ambulatory Visit: Payer: Self-pay | Admitting: Family Medicine

## 2017-11-21 DIAGNOSIS — J4541 Moderate persistent asthma with (acute) exacerbation: Secondary | ICD-10-CM

## 2018-01-14 ENCOUNTER — Ambulatory Visit: Payer: Medicare Other | Admitting: Family Medicine

## 2018-01-19 NOTE — Progress Notes (Signed)
   [facility] Phone: [phone]   Date of Visit: 01/20/2018   HPI:  Swollen Gland:  - patient reports of a history of salivary gland stone that she has had "all her life" - she had a tooth extraction on 3/25 without any complications. She was given a prescription for Amoxicillin which she did not take after the procedure - on 4/12 she started having swelling under her right mandible along with pain mainly with eating. On 4/15, "the swelling popped" and she had some drainage come out from under her tongue.  - since then she would have swelling and pain under the right mandible when she eats. The swelling self resolves after each episode - no fevers or chills  - she decided to start taking the Amoxicillin (500mg  tab) prescribed by her dentist on 4/12. She would take two to three tablets a day. She has two pills left. Reports she felt like the antibiotic did help some.   ROS: See HPI.  PMFSH:  PMH: OSA [place] OA Schizoaffective DO  Alcohol Abuse Tobacco Abuse  PHYSICAL EXAM: BP 128/84   Pulse 78   Temp 98.2 F (36.8 C) (Oral)   Ht 5\' 7"  (1.702 m)   Wt 300 lb (136.1 kg)   LMP 12/18/2017 (Approximate)   SpO2 91%   BMI 46.99 kg/m  GEN: NAD HEENT: Atraumatic, normocephalic, neck supple without lymphadenopathy or swollen glands, EOMI, sclera clear, oral cavity: location of tooth extraction appears normal (right molar). Tissue under the tongue is soft and nontender and no purulent material noted with palpation. Submandibular space is soft and nontender.  CV: RRR, no murmurs, rubs, or gallops PULM: CTAB, normal effort SKIN: No rash or cyanosis; warm and well-perfused PSYCH: Mood and affect euthymic, normal rate and volume of speech NEURO: Awake, alert, no focal deficits grossly, normal speech   ASSESSMENT/PLAN:  Sialolithiasis of submandibular gland Symptoms are most consistent with sialolithiasis. No symptoms concerning for sialadenitis. Of note,  patient has taken Amoxicillin prescribed by her dentist for this issue. No signs or symptoms of ludwig's angina.    - Ambulatory referral to ENT - recommended taking lemon drops or similar - return precautions discussed.   Palma HolterKanishka G Mayelin Panos, MD PGY 3 [facility]

## 2018-01-20 ENCOUNTER — Encounter: Payer: Self-pay | Admitting: Internal Medicine

## 2018-01-20 ENCOUNTER — Other Ambulatory Visit: Payer: Self-pay

## 2018-01-20 ENCOUNTER — Ambulatory Visit (INDEPENDENT_AMBULATORY_CARE_PROVIDER_SITE_OTHER): Payer: Medicare Other | Admitting: Internal Medicine

## 2018-01-20 VITALS — BP 128/84 | HR 78 | Temp 98.2°F | Ht 67.0 in | Wt 300.0 lb

## 2018-01-20 DIAGNOSIS — K115 Sialolithiasis: Secondary | ICD-10-CM

## 2018-01-20 NOTE — Patient Instructions (Signed)
I made a referral to the ear, nose and throat doctor. For your symptoms, you can take Ibuprofen 400mg  every 8 hours as needed (take with food)  If you start having fevers, chills, or pus from the mouth, please return to clinic   Salivary Stone A salivary stone is a mineral deposit that builds up in the ducts that drain your salivary glands. Most salivary gland stones are made of calcium. When a stone forms, saliva can back up into the gland and cause painful swelling. Your salivary glands are the glands that produce spit (saliva). You have six major salivary glands. Each gland has a duct that carries saliva into your mouth. Saliva keeps your mouth moist and breaks down the food that you eat. It also helps to prevent tooth decay. Two salivary glands are located just in front of your ears (parotid). The ducts for these glands open up inside your cheeks, near your back teeth. You also have two glands under your tongue (sublingual) and two glands under your jaw (submandibular). The ducts for these glands open under your tongue. A stone can form in any salivary gland. The most common place for a salivary stone to develop is in a submandibular salivary gland. What are the causes? Any condition that reduces the flow of saliva may lead to stone formation. It is not known why some people form stones and others do not. What increases the risk? You may be more likely to develop a salivary stone if you:  Are female.  Do not drink enough water.  Smoke.  Have high blood pressure.  Have gout.  Have diabetes.  What are the signs or symptoms? The main sign of a salivary gland stone is sudden swelling of a salivary gland when eating. This usually happens under the jaw on one side. Other signs and symptoms include:  Swelling of the cheek or under the tongue when eating.  Pain in the swollen area.  Trouble chewing or swallowing.  Swelling that goes down after eating.  How is this diagnosed? Your  health care provider may diagnose a salivary gland stone based on your signs and symptoms. The health care provider will also do a physical exam. In many cases, a stone can be felt in a duct inside your mouth. You may need to see an ear, nose, and throat specialist (ENT or otolaryngologist) for diagnosis and treatment. You may also need to have diagnostic tests. These may include imaging studies to check for a stone, such as:  X-rays.  Ultrasound.  CT scan.  MRI.  How is this treated? Home care may be enough to treat a small stone that is not causing symptoms. Treatment of a stone that is large enough to cause symptoms may include:  Probing and widening the duct to allow the stone to pass.  Inserting a thin, flexible scope (endoscope) into the duct to locate and remove the stone.  Breaking up the stone with sound waves.  Removing the entire salivary gland.  Follow these instructions at home:  Drink enough fluid to keep your urine clear or pale yellow.  Follow these instructions every few hours: ? Suck on a lemon candy to stimulate the flow of saliva. ? Put a hot compress over the gland. ? Gently massage the gland.  Do not use any tobacco products, including cigarettes, chewing tobacco, or electronic cigarettes. If you need help quitting, ask your health care provider. Contact a health care provider if:  You have pain and swelling in  your face, jaw, or mouth after eating.  You have persistent swelling in any of these places: ? In front of your ear. ? Under your jaw. ? Inside your mouth. Get help right away if:  You have pain and swelling in your face, jaw, or mouth that are getting worse.  Your pain and swelling make it hard to swallow or breathe. This information is not intended to replace advice given to you by your health care provider. Make sure you discuss any questions you have with your health care provider. Document Released: 10/25/2004 Document Revised: 02/23/2016  Document Reviewed: 02/17/2014 Elsevier Interactive Patient Education  2018 [employer]

## 2018-01-23 ENCOUNTER — Other Ambulatory Visit: Payer: Self-pay | Admitting: Family Medicine

## 2018-02-18 ENCOUNTER — Other Ambulatory Visit: Payer: Self-pay | Admitting: Family Medicine

## 2018-02-18 DIAGNOSIS — J4541 Moderate persistent asthma with (acute) exacerbation: Secondary | ICD-10-CM

## 2018-05-02 ENCOUNTER — Other Ambulatory Visit: Payer: Self-pay | Admitting: Family Medicine

## 2018-05-02 DIAGNOSIS — J4541 Moderate persistent asthma with (acute) exacerbation: Secondary | ICD-10-CM

## 2018-07-09 ENCOUNTER — Other Ambulatory Visit: Payer: Self-pay | Admitting: Family Medicine

## 2018-09-17 ENCOUNTER — Other Ambulatory Visit: Payer: Self-pay | Admitting: Family Medicine

## 2018-11-26 ENCOUNTER — Ambulatory Visit: Payer: Medicare Other

## 2018-11-26 NOTE — Progress Notes (Deleted)
   CC: ***  HPI: Tonya Gill is a 43 y.o. female with PMH significant for *** who presents to [facility] today with *** of *** duration.   Overdue health maintenance ***  Review of Symptoms:  See HPI for ROS.   CC, SH/smoking status, and VS noted.  Objective: There were no vitals taken for this visit. GEN: NAD, alert, cooperative, and pleasant.*** EYE: no conjunctival injection, pupils equally round and reactive to light ENMT: normal tympanic light reflex, no nasal polyps,no rhinorrhea, no pharyngeal erythema or exudates NECK: full ROM, no thyromegaly RESPIRATORY: clear to auscultation bilaterally with no wheezes, rhonchi or rales, good effort CV: RRR, no m/r/g, no peripheral edema GI: soft, non-tender, non-distended, no hepatosplenomegaly SKIN: warm and dry, no rashes or lesions NEURO: II-XII grossly intact, normal gait, peripheral sensation intact PSYCH: AAOx3, appropriate affect  Flu Vaccine: *** Tdap Vaccine: *** - every 73yrs - (<3 lifetime doses or unknown): all wounds -- look up need for Tetanus IG - (>=3 lifetime doses): clean/minor wound if >43yrs from previous; all other wounds if >93yrs from previous Zoster Vaccine: *** (those >50yo, once) Pneumonia Vaccine: *** (those w/ risk factors) - (<76yr) Both: Immunocompromised, cochlear implant, CSF leak, asplenic, sickle cell, Chronic Renal Failure - (<89yr) PPSV-23 only: Heart dz, lung disease, DM, tobacco abuse, alcoholism, cirrhosis/liver disease. - (>61yr): PPSV13 then PPSV23 in 6-12mths;  - (>40yr): repeat PPSV23 once if pt received prior to 45yo and 24yrs have passed  Assessment and plan:  No problem-specific Assessment & Plan notes found for this encounter.   No orders of the defined types were placed in this encounter.   No orders of the defined types were placed in this encounter.    Howard Pouch, MD,MS,  PGY3 11/26/2018 10:03 AM

## 2018-12-16 ENCOUNTER — Other Ambulatory Visit: Payer: Self-pay | Admitting: Family Medicine

## 2019-02-12 ENCOUNTER — Telehealth: Payer: Self-pay | Admitting: *Deleted

## 2019-02-12 ENCOUNTER — Other Ambulatory Visit: Payer: Self-pay

## 2019-02-12 ENCOUNTER — Telehealth: Payer: Medicare Other | Admitting: Family Medicine

## 2019-02-12 NOTE — Progress Notes (Signed)
Called patient several times for telephone virtual visits. Tried both numbers, no answer. Left generic VM.

## 2019-02-12 NOTE — Telephone Encounter (Signed)
LVM to call office back.  Tried to call and do pre-chart prior to visit with doctor. Meeah Totino Zimmerman Rumple, CMA

## 2019-05-22 ENCOUNTER — Other Ambulatory Visit: Payer: Self-pay

## 2019-05-22 MED ORDER — ALBUTEROL SULFATE HFA 108 (90 BASE) MCG/ACT IN AERS: INHALATION_SPRAY | RESPIRATORY_TRACT | 0 refills | Status: DC

## 2019-05-22 NOTE — Telephone Encounter (Signed)
Ms. Tonya Gill seems to be going through an incredible amount of albuterol.  She should be seen to assess her asthma and ensure that she has appropriate controller meds.  Please call and have her schedule an appointment to be seen for her asthma.  I have given her one MDI refill but would like her to be seen before additional refills are given.  Matilde Haymaker, MD

## 2019-05-25 ENCOUNTER — Telehealth: Payer: Self-pay

## 2019-05-25 NOTE — Telephone Encounter (Signed)
Pt has appt on 05/27/2019 with Dr. Susa Simmonds. Salvatore Marvel, CMA

## 2019-05-25 NOTE — Telephone Encounter (Signed)
LVM for pt to call office to make appt with Dr. Frank to be seen for asthma and refills. Ezequias Lard, CMA  

## 2019-05-25 NOTE — Telephone Encounter (Signed)
LVM for pt to call office to make appt with Dr. Pilar Plate to be seen for asthma and refills. Salvatore Marvel, CMA

## 2019-05-27 ENCOUNTER — Ambulatory Visit: Payer: Medicare Other | Admitting: Family Medicine

## 2019-06-12 ENCOUNTER — Other Ambulatory Visit: Payer: Self-pay | Admitting: Family Medicine

## 2019-06-15 NOTE — Telephone Encounter (Signed)
LM for patient to call back and make an appt to refill her medication.  Jamion Carter,CMA

## 2019-06-15 NOTE — Telephone Encounter (Signed)
Ms. Tonya Gill was given a 1 month refill and asked to be seen in clinic before additional refills were filled.  She has scheduled and canceled her appointment.  Please let her know that she needs to be seen in clinic before additional refills will be prescribed.  To my knowledge, she has no acute illness.  Johm Pfannenstiel, MD 

## 2019-06-18 ENCOUNTER — Other Ambulatory Visit: Payer: Self-pay | Admitting: Family Medicine

## 2019-06-18 NOTE — Telephone Encounter (Signed)
Tonya Gill was given a 1 month refill and asked to be seen in clinic before additional refills were filled.  She has scheduled and canceled her appointment.  Please let her know that she needs to be seen in clinic before additional refills will be prescribed.  To my knowledge, she has no acute illness.  Tonya Haymaker, MD

## 2019-06-18 NOTE — Telephone Encounter (Signed)
Attempted to call pt. No answer. LVM for pt to call to schedule appt for a follow up visit for medication refill. Tonya Gill, CMA

## 2019-07-13 ENCOUNTER — Other Ambulatory Visit: Payer: Self-pay

## 2019-07-13 ENCOUNTER — Ambulatory Visit (INDEPENDENT_AMBULATORY_CARE_PROVIDER_SITE_OTHER): Payer: Medicare Other | Admitting: Family Medicine

## 2019-07-13 ENCOUNTER — Encounter: Payer: Self-pay | Admitting: Family Medicine

## 2019-07-13 VITALS — BP 118/70 | HR 102

## 2019-07-13 DIAGNOSIS — R0902 Hypoxemia: Secondary | ICD-10-CM

## 2019-07-13 DIAGNOSIS — R0600 Dyspnea, unspecified: Secondary | ICD-10-CM | POA: Diagnosis not present

## 2019-07-13 DIAGNOSIS — Z13220 Encounter for screening for lipoid disorders: Secondary | ICD-10-CM | POA: Diagnosis not present

## 2019-07-13 DIAGNOSIS — E785 Hyperlipidemia, unspecified: Secondary | ICD-10-CM

## 2019-07-13 DIAGNOSIS — J4541 Moderate persistent asthma with (acute) exacerbation: Secondary | ICD-10-CM | POA: Diagnosis not present

## 2019-07-13 DIAGNOSIS — F17200 Nicotine dependence, unspecified, uncomplicated: Secondary | ICD-10-CM | POA: Diagnosis not present

## 2019-07-13 DIAGNOSIS — G4733 Obstructive sleep apnea (adult) (pediatric): Secondary | ICD-10-CM | POA: Diagnosis not present

## 2019-07-13 MED ORDER — CHANTIX STARTING MONTH PAK 0.5 MG X 11 & 1 MG X 42 PO TABS: ORAL_TABLET | ORAL | 0 refills | Status: DC

## 2019-07-13 MED ORDER — PULMICORT FLEXHALER 90 MCG/ACT IN AEPB: 2.0000 | INHALATION_SPRAY | Freq: Two times a day (BID) | RESPIRATORY_TRACT | 2 refills | Status: DC

## 2019-07-13 MED ORDER — ALBUTEROL SULFATE HFA 108 (90 BASE) MCG/ACT IN AERS: INHALATION_SPRAY | RESPIRATORY_TRACT | 0 refills | Status: DC

## 2019-07-13 MED ORDER — NICOTINE 14 MG/24HR TD PT24: 14.0000 mg | MEDICATED_PATCH | Freq: Every day | TRANSDERMAL | 1 refills | Status: DC

## 2019-07-13 NOTE — Patient Instructions (Signed)
Glad you came in today.  Here s a quick review:  -Set a date to throw out your cigarettes -Begin taking Chantix today -Begin taking nicotine patches on the day you stop smoking -We will follow-up regarding your blood work -Call us back if you have not heard regarding a lung doctor in 1 week -Please start using you CPAP  I'd like to see you again in 3 weeks.

## 2019-07-13 NOTE — Assessment & Plan Note (Addendum)
This appears to be a chronic issue.  She appears very comfortable sitting in the chair in clinic this afternoon.  She is able to ambulate around the office descending to 83%.  A PFT from 2015 shows moderate restrictive lung disease.  In clinic today, she appears to have poorly controlled asthma, OSA, OHS and likely symptoms of chronic bronchitis.  She would clearly benefit from being more adherent to her current therapies.  Due to the chronic nature of this hypoxemia and her well appearance, no supplemental oxygen was needed in clinic.  She was amenable to following up with pulmonology on outpatient basis.  The following recommendations were made:  -Ambulatory referral to pulmonology -Resume Pulmicort daily -Use albuterol only as needed for wheezing and respiratory distress -Smoking cessation as below -Begin using CPAP nightly -Follow-up chest x-ray -Follow-up BNP, BMP, CBC

## 2019-07-13 NOTE — Assessment & Plan Note (Signed)
She was encouraged to use her CPAP

## 2019-07-13 NOTE — Assessment & Plan Note (Signed)
She reports attempting cessation previously with nicotine patches.  She would like to attempt cessation with the aid of Chantix. -Chantix 1 month starter pack was prescribed -Nicotine patches were provided for cravings when she decides to stop -She was encouraged to set a date throughout all of her cigarettes and stop smoking entirely within the next several weeks.

## 2019-07-13 NOTE — Progress Notes (Signed)
    Subjective:  Tonya Gill is a 43 y.o. female who presents to the [facility] today with a chief complaint of asthma follow-up.   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 Kluttz reports that she was diagnosed with asthma many years ago and has been using albuterol for respiratory issues.  She is previously been prescribed budesonide but she did not find it helpful no longer takes it.  She reports no acute changes in her respiratory status that she has noticed a chronic decline in the past months.  She finds her self short of breath in the morning which is usually accompanied by vigorous coughing and sputum production.  The sputum is nonbloody and clear.  She has never been diagnosed with COPD.  She does notice that she becomes short of breath with mild exertion.  This is not accompanied by chest pain.  She has never seen a pulmonologist.  OSA She is previously had a sleep study performed and been diagnosed with obstructive sleep apnea.  She has been prescribed a CPAP machine which she does not use.  Nicotine use She reports that she has been smoking for about the past 30 years.  At her peak, she smokes as much as 1 pack/day.  Currently she smokes about 1/2 pack/day.  Chief Complaint noted Review of Symptoms - see HPI PMH - Smoking status noted.    Objective:  Physical Exam: BP 118/70   Pulse (!) 102   LMP 05/13/2019 (Approximate)   SpO2 (!) 84% Comment: walking o2 89%-83%  13 BPM   Gen: Morbidly obese white female.  NAD, resting comfortably CV: Mildly tachycardic.  Regular rhythm.  No appreciable murmurs, gallops. Pulm: NWOB, CTAB with no crackles, wheezes, or rhonchi GI: Normal bowel sounds present. Soft, Nontender, Nondistended. MSK: Minimal spooning of the fingernails.  No results found for this or any previous visit (from the past 72 hour(s)).   Assessment/Plan:  Hypoxemia This appears to be a chronic issue.  She appears very comfortable sitting  in the chair in clinic this afternoon.  She is able to ambulate around the office descending to 83%.  A PFT from 2015 shows moderate restrictive lung disease.  In clinic today, she appears to have poorly controlled asthma, OSA, OHS and likely symptoms of chronic bronchitis.  She would clearly benefit from being more adherent to her current therapies.  Due to the chronic nature of this hypoxemia and her well appearance, no supplemental oxygen was needed in clinic.  She was amenable to following up with pulmonology on outpatient basis.  The following recommendations were made:  -Ambulatory referral to pulmonology -Resume Pulmicort daily -Use albuterol only as needed for wheezing and respiratory distress -Smoking cessation as below -Begin using CPAP nightly -Follow-up chest x-ray -Follow-up BNP, BMP, CBC  TOBACCO ABUSE She reports attempting cessation previously with nicotine patches.  She would like to attempt cessation with the aid of Chantix. -Chantix 1 month starter pack was prescribed -Nicotine patches were provided for cravings when she decides to stop -She was encouraged to set a date throughout all of her cigarettes and stop smoking entirely within the next several weeks.  Sleep apnea She was encouraged to use her CPAP  Follow-up in clinic in 3 weeks

## 2019-07-14 LAB — BASIC METABOLIC PANEL
BUN/Creatinine Ratio: 16 (ref 9–23)
BUN: 9 mg/dL (ref 6–24)
CO2: 24 mmol/L (ref 20–29)
Calcium: 9.4 mg/dL (ref 8.7–10.2)
Chloride: 98 mmol/L (ref 96–106)
Creatinine, Ser: 0.55 mg/dL — ABNORMAL LOW (ref 0.57–1.00)
GFR calc Af Amer: 131 mL/min/{1.73_m2} (ref >59)
GFR calc non Af Amer: 114 mL/min/{1.73_m2} (ref >59)
Glucose: 80 mg/dL (ref 65–99)
Potassium: 4.9 mmol/L (ref 3.5–5.2)
Sodium: 137 mmol/L (ref 134–144)

## 2019-07-14 LAB — LIPID PANEL
Chol/HDL Ratio: 3.9 ratio (ref 0.0–4.4)
Cholesterol, Total: 226 mg/dL — ABNORMAL HIGH (ref 100–199)
HDL: 58 mg/dL (ref >39)
LDL Chol Calc (NIH): 144 mg/dL — ABNORMAL HIGH (ref 0–99)
Triglycerides: 137 mg/dL (ref 0–149)
VLDL Cholesterol Cal: 24 mg/dL (ref 5–40)

## 2019-07-14 LAB — CBC

## 2019-07-14 LAB — BRAIN NATRIURETIC PEPTIDE: BNP: 73.9 pg/mL (ref 0.0–100.0)

## 2019-07-15 ENCOUNTER — Telehealth: Payer: Self-pay | Admitting: *Deleted

## 2019-07-15 NOTE — Telephone Encounter (Signed)
Approval from 04/16/2019 - 01/11/2020.  Pharmacy informed. Christen Bame, CMA

## 2019-07-15 NOTE — Telephone Encounter (Signed)
Received fax from pharmacy, PA needed on chantix.  Clinical questions submitted via Cover My Meds.  Waiting on response, could take up to 72 hours.  Cover My Meds info: Key: Jerry Caras, CMA

## 2019-07-31 ENCOUNTER — Other Ambulatory Visit: Payer: Self-pay | Admitting: Family Medicine

## 2019-08-01 ENCOUNTER — Other Ambulatory Visit: Payer: Self-pay | Admitting: Family Medicine

## 2019-08-01 MED ORDER — FLUTICASONE PROPIONATE 50 MCG/ACT NA SUSP: NASAL | 10 refills | Status: DC

## 2019-08-01 MED ORDER — VARENICLINE TARTRATE 1 MG PO TABS: 1.0000 mg | ORAL_TABLET | Freq: Two times a day (BID) | ORAL | 1 refills | Status: DC

## 2019-08-11 ENCOUNTER — Encounter: Payer: Self-pay | Admitting: Family Medicine

## 2019-08-17 ENCOUNTER — Other Ambulatory Visit: Payer: Self-pay | Admitting: Family Medicine

## 2019-09-04 ENCOUNTER — Other Ambulatory Visit: Payer: Self-pay | Admitting: Family Medicine

## 2019-09-10 ENCOUNTER — Other Ambulatory Visit: Payer: Self-pay | Admitting: Family Medicine

## 2019-09-10 DIAGNOSIS — J4541 Moderate persistent asthma with (acute) exacerbation: Secondary | ICD-10-CM

## 2019-09-21 ENCOUNTER — Other Ambulatory Visit: Payer: Self-pay | Admitting: Family Medicine

## 2019-10-14 ENCOUNTER — Other Ambulatory Visit: Payer: Self-pay | Admitting: Family Medicine

## 2019-10-19 ENCOUNTER — Other Ambulatory Visit: Payer: Self-pay | Admitting: Family Medicine

## 2019-11-23 ENCOUNTER — Other Ambulatory Visit: Payer: Self-pay | Admitting: Family Medicine

## 2019-11-23 DIAGNOSIS — J4541 Moderate persistent asthma with (acute) exacerbation: Secondary | ICD-10-CM

## 2019-11-24 NOTE — Telephone Encounter (Signed)
LM for patient to call back and schedule an appointment to discuss her inhalers.  Were you planning to send in a month supply of these medications?  Melayna Robarts,CMA

## 2019-11-24 NOTE — Telephone Encounter (Signed)
Please call [name] and have her schedule an appointment in the next 1-2 months to discuss her breathing.   Thank you, Tonya Mo, MD

## 2019-11-26 ENCOUNTER — Telehealth: Payer: Self-pay | Admitting: *Deleted

## 2019-11-26 NOTE — Progress Notes (Signed)
    SUBJECTIVE:   CHIEF COMPLAINT / HPI:   Asthma She believes that her asthma is moderately controlled at this time although she does seem to rely on her inhaler.  She reports that she uses her inhaler every single day when she wakes up in the morning.  Upon waking, she notices significant shortness of breath and chest tightness for which she takes her inhaler and gets relief.  After her morning inhaler use, she rarely uses it later in the day.  She denies any significant wheezing but does have a chronic, very mildly productive cough. She also takes her Pulmicort inhaler every night.  At our last visit, we discussed a referral to pulmonology.  She does not recall ever being contacted regarding a pulmonology referral.  Sleep apnea She has been diagnosed with OSA and has a CPAP at home although it broke up in the past year she is not currently using it.  Smoking She reports taking a roughly 1 month of Chantix following her last visit but did not report any significant improvement in her cravings.  She continues to smoke roughly 1.5 packs/day.  She is interested in smoking cessation.   PERTINENT  PMH / PSH: Obesity, OSA, current smoker, asthma  OBJECTIVE:   BP 126/70   Pulse 93   Wt 288 lb (130.6 kg)   LMP 11/26/2019   SpO2 91%   BMI 45.11 kg/m    General: Morbidly obese 44 year old woman.  Smelling strongly of cigarettes. Cardiac: Regular rate and rhythm, no murmurs/rubs/gallops. Respiratory: Wheezing noted in multiple lung fields.  Breathing comfortably on room air with normal respiratory effort.  Able to complete long sentences without effort.  ASSESSMENT/PLAN:   Sleep apnea Does not currently have a functional CPAP. -Referral to sleep medicine  TOBACCO ABUSE Worked on goalsetting today. -Start Chantix tomorrow -Plan to reduce cigarette use from 1.5 packs/day to 0.5 packs/day in 1 month -Follow-up in 1 month  Asthma, persistent Unclear what happened with last referral.   Plan to refer her again to pulmonology for further assessment of her pulmonary disease. -Albuterol refilled, advised on proper albuterol use     Mirian Mo, MD [facility]

## 2019-11-26 NOTE — Telephone Encounter (Signed)
LVM to call office to go over screening questions.Nesta Kimple Zimmerman Rumple, CMA

## 2019-11-27 ENCOUNTER — Encounter: Payer: Self-pay | Admitting: Family Medicine

## 2019-11-27 ENCOUNTER — Ambulatory Visit (INDEPENDENT_AMBULATORY_CARE_PROVIDER_SITE_OTHER): Payer: Medicare Other | Admitting: Family Medicine

## 2019-11-27 ENCOUNTER — Other Ambulatory Visit: Payer: Self-pay

## 2019-11-27 VITALS — BP 126/70 | HR 93 | Wt 288.0 lb

## 2019-11-27 DIAGNOSIS — G4733 Obstructive sleep apnea (adult) (pediatric): Secondary | ICD-10-CM

## 2019-11-27 DIAGNOSIS — F17200 Nicotine dependence, unspecified, uncomplicated: Secondary | ICD-10-CM | POA: Diagnosis not present

## 2019-11-27 DIAGNOSIS — J984 Other disorders of lung: Secondary | ICD-10-CM | POA: Diagnosis not present

## 2019-11-27 DIAGNOSIS — R0902 Hypoxemia: Secondary | ICD-10-CM

## 2019-11-27 DIAGNOSIS — E669 Obesity, unspecified: Secondary | ICD-10-CM | POA: Diagnosis not present

## 2019-11-27 NOTE — Addendum Note (Signed)
Addended by: Nada Libman on: 11/27/2019 07:05 PM   Modules accepted: Orders

## 2019-11-27 NOTE — Assessment & Plan Note (Signed)
Unclear what happened with last referral.  Plan to refer her again to pulmonology for further assessment of her pulmonary disease. -Albuterol refilled, advised on proper albuterol use

## 2019-11-27 NOTE — Assessment & Plan Note (Signed)
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 -Start Chantix tomorrow -Plan to reduce cigarette use from 1.5 packs/day to 0.5 packs/day in 1 month -Follow-up in 1 month

## 2019-11-27 NOTE — Assessment & Plan Note (Signed)
Does not currently have a functional CPAP. -Referral to sleep medicine

## 2019-11-27 NOTE — Patient Instructions (Addendum)
I am worried about your breathing trouble and I think we need to look into it.  I am going to put in another referral for you to see the lung doctors.  Please call the clinic if you have not received a call in the next 2 weeks.  He should get a call to set up an appointment with the lung doctors.  I like to go you set to reduce your smoking.  Lets shoot to reduce your smoking from 1.5 packs/day to half a pack per day in the next month.  I think starting Chantix tomorrow is also a good idea.  Lets follow-up in 1 month and see how you are doing.  When you return to clinic in 1 month, we will do your Pap smear.   Smoking Tobacco Information, Adult Smoking tobacco can be harmful to your health. Tobacco contains a poisonous (toxic), colorless chemical called nicotine. Nicotine is addictive. It changes the brain and can make it hard to stop smoking. Tobacco also has other toxic chemicals that can hurt your body and raise your risk of many cancers. How can smoking tobacco affect me? Smoking tobacco puts you at risk for:  Cancer. Smoking is most commonly associated with lung cancer, but can also lead to cancer in other parts of the body.  Chronic obstructive pulmonary disease (COPD). This is a long-term lung condition that makes it hard to breathe. It also gets worse over time.  High blood pressure (hypertension), heart disease, stroke, or heart attack.  Lung infections, such as pneumonia.  Cataracts. This is when the lenses in the eyes become clouded.  Digestive problems. This may include peptic ulcers, heartburn, and gastroesophageal reflux disease (GERD).  Oral health problems, such as gum disease and tooth loss.  Loss of taste and smell. Smoking can affect your appearance by causing:  Wrinkles.  Yellow or stained teeth, fingers, and fingernails. Smoking tobacco can also affect your social life, because:  It may be challenging to find places to smoke when away from home. Many workplaces,  [employer], hotels, and public places are tobacco-free.  Smoking is expensive. This is due to the cost of tobacco and the long-term costs of treating health problems from smoking.  Secondhand smoke may affect those around you. Secondhand smoke can cause lung cancer, breathing problems, and heart disease. Children of smokers have a higher risk for: ? Sudden infant death syndrome (SIDS). ? Ear infections. ? Lung infections. If you currently smoke tobacco, quitting now can help you:  Lead a longer and healthier life.  Look, smell, breathe, and feel better over time.  Save money.  Protect others from the harms of secondhand smoke. What actions can I take to prevent health problems? Quit smoking   Do not start smoking. Quit if you already do.  Make a plan to quit smoking and commit to it. Look for programs to help you and ask your health care provider for recommendations and ideas.  Set a date and write down all the reasons you want to quit.  Let your friends and family know you are quitting so they can help and support you. Consider finding friends who also want to quit. It can be easier to quit with someone else, so that you can support each other.  Talk with your health care provider about using nicotine replacement medicines to help you quit, such as gum, lozenges, patches, sprays, or pills.  Do not replace cigarette smoking with electronic cigarettes, which are commonly called e-cigarettes. The  safety of e-cigarettes is not known, and some may contain harmful chemicals.  If you try to quit but return to smoking, stay positive. It is common to slip up when you first quit, so take it one day at a time.  Be prepared for cravings. When you feel the urge to smoke, chew gum or suck on hard candy. Lifestyle  Stay busy and take care of your body.  Drink enough fluid to keep your urine pale yellow.  Get plenty of exercise and eat a healthy diet. This can help prevent weight gain  after quitting.  Monitor your eating habits. Quitting smoking can cause you to have a larger appetite than when you smoke.  Find ways to relax. Go out with friends or family to a movie or a restaurant where people do not smoke.  Ask your health care provider about having regular tests (screenings) to check for cancer. This may include blood tests, imaging tests, and other tests.  Find ways to manage your stress, such as meditation, yoga, or exercise. Where to find support To get support to quit smoking, consider:  Asking your health care provider for more information and resources.  Taking classes to learn more about quitting smoking.  Looking for local organizations that offer resources about quitting smoking.  Joining a support group for people who want to quit smoking in your local community.  Calling the smokefree.gov counselor helpline: 1-800-Quit-Now 224-159-9866) Where to find more information You may find more information about quitting smoking from:  HelpGuide.org: www.helpguide.org  [employer]: smokefree.gov  American Lung Association: www.lung.org Contact a health care provider if you:  Have problems breathing.  Notice that your lips, nose, or fingers turn blue.  Have chest pain.  Are coughing up blood.  Feel faint or you pass out.  Have other health changes that cause you to worry. Summary  Smoking tobacco can negatively affect your health, the health of those around you, your finances, and your social life.  Do not start smoking. Quit if you already do. If you need help quitting, ask your health care provider.  Think about joining a support group for people who want to quit smoking in your local community. There are many effective programs that will help you to quit this behavior. This information is not intended to replace advice given to you by your health care provider. Make sure you discuss any questions you have with your health care  provider. Document Revised: 06/12/2019 Document Reviewed: 10/02/2016 Elsevier Patient Education  2020 [employer].

## 2019-12-02 ENCOUNTER — Ambulatory Visit: Payer: Self-pay | Admitting: Licensed Clinical Social Worker

## 2019-12-02 NOTE — Chronic Care Management (AMB) (Signed)
  Social Work Care Management  Unsuccessful Phone Outreach   12/02/2019 Name: ADRIEL DESROSIER MRN: [id number] DOB: 1975/10/02  Referred by: Mirian Mo, MD,  Reason for referral : Care Coordination (transportation resources)   JACQULIN BRANDENBURGER is a 44 y.o. year old female who sees Mirian Mo, MD for primary care.   LCSW called patient to assess needs and barriers reference the above referral. Telephone outreach was unsuccessful. A HIPPA compliant phone message was left for the patient providing contact information and requesting a return call.  Plan:  If no return call is received. LCSW will call again in 3 to 5 days.  Sammuel Hines, LCSW Clinical Social Worker [facility]   [phone] 3:51 PM

## 2019-12-04 ENCOUNTER — Other Ambulatory Visit: Payer: Self-pay

## 2019-12-04 ENCOUNTER — Ambulatory Visit: Payer: Self-pay | Admitting: Licensed Clinical Social Worker

## 2019-12-04 ENCOUNTER — Ambulatory Visit: Payer: Medicare Other | Admitting: Licensed Clinical Social Worker

## 2019-12-04 DIAGNOSIS — Z139 Encounter for screening, unspecified: Secondary | ICD-10-CM

## 2019-12-04 NOTE — Chronic Care Management (AMB) (Signed)
A user error has taken place: encounter opened in error, closed for administrative reasons.

## 2019-12-04 NOTE — Chronic Care Management (AMB) (Signed)
   Clinical Social Work  Care Management referral   12/04/2019 Name: Tonya Gill MRN: [id number] DOB: 07/15/1976  Tonya Gill is a 44 y.o. year old female who is a primary care patient of Mirian Mo, MD . LCSW was consulted by PCP for assistance with Transportation Needs .   LCSW reached out to [employer] today by phone to introduce self, assess needs and offer Care Management services and interventions.   Patient denies a need for transportation resources, states step dad takes her to appointments as well as to the store. Reports she lives close enough to walk to [facility] and has walked in the past. Patient denies needing reminders for appointments states she writes them down.  SDOH (Social Determinants of Health) assessments performed: Yes:  SDOH Interventions     Most Recent Value  SDOH Interventions  SDOH Interventions for the Following Domains  Transportation  Transportation Interventions  Patient Refused      Intervention: assessed patient's needs. None identified  Review of patient status, including review of consultants reports, relevant laboratory and other test results, and collaboration with appropriate care team members and the patient's provider was performed as part of comprehensive patient evaluation and provision of care management services.    Plan:  No follow up needed  Sammuel Hines, LCSW Clinical Social Worker [facility]   [phone] 2:10 PM

## 2019-12-21 ENCOUNTER — Other Ambulatory Visit: Payer: Self-pay | Admitting: Family Medicine

## 2020-01-04 ENCOUNTER — Other Ambulatory Visit: Payer: Self-pay | Admitting: Family Medicine

## 2020-02-17 ENCOUNTER — Other Ambulatory Visit: Payer: Self-pay | Admitting: Family Medicine

## 2020-03-09 ENCOUNTER — Other Ambulatory Visit: Payer: Self-pay | Admitting: Family Medicine

## 2020-04-12 ENCOUNTER — Other Ambulatory Visit: Payer: Self-pay | Admitting: Family Medicine

## 2020-05-09 ENCOUNTER — Other Ambulatory Visit: Payer: Self-pay | Admitting: Family Medicine

## 2020-06-08 ENCOUNTER — Other Ambulatory Visit: Payer: Self-pay | Admitting: Family Medicine

## 2020-07-07 ENCOUNTER — Other Ambulatory Visit: Payer: Self-pay | Admitting: Family Medicine

## 2020-08-02 ENCOUNTER — Other Ambulatory Visit: Payer: Self-pay | Admitting: Family Medicine

## 2020-08-30 ENCOUNTER — Other Ambulatory Visit: Payer: Self-pay | Admitting: Family Medicine

## 2020-10-25 ENCOUNTER — Other Ambulatory Visit: Payer: Self-pay | Admitting: Family Medicine

## 2020-11-21 ENCOUNTER — Other Ambulatory Visit: Payer: Self-pay | Admitting: Family Medicine

## 2020-12-07 ENCOUNTER — Emergency Department (HOSPITAL_COMMUNITY)
Admission: EM | Admit: 2020-12-07 | Discharge: 2020-12-17 | Disposition: A | Discharge status: Home / Self Care | Payer: Medicare Other | Attending: Emergency Medicine | Admitting: Emergency Medicine

## 2020-12-07 DIAGNOSIS — F17210 Nicotine dependence, cigarettes, uncomplicated: Secondary | ICD-10-CM | POA: Diagnosis not present

## 2020-12-07 DIAGNOSIS — Z20822 Contact with and (suspected) exposure to covid-19: Secondary | ICD-10-CM | POA: Diagnosis not present

## 2020-12-07 DIAGNOSIS — R7989 Other specified abnormal findings of blood chemistry: Secondary | ICD-10-CM | POA: Diagnosis not present

## 2020-12-07 DIAGNOSIS — Z79899 Other long term (current) drug therapy: Secondary | ICD-10-CM | POA: Insufficient documentation

## 2020-12-07 DIAGNOSIS — Z7951 Long term (current) use of inhaled steroids: Secondary | ICD-10-CM | POA: Insufficient documentation

## 2020-12-07 DIAGNOSIS — Z046 Encounter for general psychiatric examination, requested by authority: Secondary | ICD-10-CM | POA: Diagnosis present

## 2020-12-07 DIAGNOSIS — J4540 Moderate persistent asthma, uncomplicated: Secondary | ICD-10-CM | POA: Diagnosis not present

## 2020-12-07 DIAGNOSIS — D751 Secondary polycythemia: Secondary | ICD-10-CM | POA: Diagnosis not present

## 2020-12-07 DIAGNOSIS — R0902 Hypoxemia: Secondary | ICD-10-CM | POA: Diagnosis not present

## 2020-12-07 DIAGNOSIS — F200 Paranoid schizophrenia: Secondary | ICD-10-CM

## 2020-12-07 DIAGNOSIS — J984 Other disorders of lung: Secondary | ICD-10-CM | POA: Diagnosis not present

## 2020-12-07 DIAGNOSIS — I517 Cardiomegaly: Secondary | ICD-10-CM | POA: Diagnosis not present

## 2020-12-07 DIAGNOSIS — F250 Schizoaffective disorder, bipolar type: Secondary | ICD-10-CM | POA: Diagnosis not present

## 2020-12-07 DIAGNOSIS — F1010 Alcohol abuse, uncomplicated: Secondary | ICD-10-CM | POA: Diagnosis not present

## 2020-12-07 DIAGNOSIS — F23 Brief psychotic disorder: Secondary | ICD-10-CM

## 2020-12-07 LAB — CBC
HCT: 64 % — ABNORMAL HIGH (ref 36.0–46.0)
Hemoglobin: 22 g/dL (ref 12.0–15.0)
MCH: 32.9 pg (ref 26.0–34.0)
MCHC: 34.4 g/dL (ref 30.0–36.0)
MCV: 95.7 fL (ref 80.0–100.0)
Platelets: 189 10*3/uL (ref 150–400)
RBC: 6.69 MIL/uL — ABNORMAL HIGH (ref 3.87–5.11)
RDW: 15.4 % (ref 11.5–15.5)
WBC: 10.9 10*3/uL — ABNORMAL HIGH (ref 4.0–10.5)
nRBC: 0 % (ref 0.0–0.2)

## 2020-12-07 LAB — COMPREHENSIVE METABOLIC PANEL
ALT: 22 U/L (ref 0–44)
AST: 26 U/L (ref 15–41)
Albumin: 3.3 g/dL — ABNORMAL LOW (ref 3.5–5.0)
Alkaline Phosphatase: 79 U/L (ref 38–126)
Anion gap: 10 (ref 5–15)
BUN: 13 mg/dL (ref 6–20)
CO2: 28 mmol/L (ref 22–32)
Calcium: 9.2 mg/dL (ref 8.9–10.3)
Chloride: 95 mmol/L — ABNORMAL LOW (ref 98–111)
Creatinine, Ser: 0.88 mg/dL (ref 0.44–1.00)
GFR, Estimated: >60 mL/min (ref >60)
Glucose, Bld: 92 mg/dL (ref 70–99)
Potassium: 4.3 mmol/L (ref 3.5–5.1)
Sodium: 133 mmol/L — ABNORMAL LOW (ref 135–145)
Total Bilirubin: 0.8 mg/dL (ref 0.3–1.2)
Total Protein: 7.4 g/dL (ref 6.5–8.1)

## 2020-12-07 LAB — I-STAT BETA HCG BLOOD, ED (MC, WL, AP ONLY): I-stat hCG, quantitative: <5 m[IU]/mL (ref <5)

## 2020-12-07 LAB — RAPID URINE DRUG SCREEN, HOSP PERFORMED
Amphetamines: NOT DETECTED
Barbiturates: NOT DETECTED
Benzodiazepines: NOT DETECTED
Cocaine: NOT DETECTED
Opiates: POSITIVE — AB
Tetrahydrocannabinol: POSITIVE — AB

## 2020-12-07 LAB — ACETAMINOPHEN LEVEL: Acetaminophen (Tylenol), Serum: <10 ug/mL — ABNORMAL LOW (ref 10–30)

## 2020-12-07 LAB — SALICYLATE LEVEL: Salicylate Lvl: <7 mg/dL — ABNORMAL LOW (ref 7.0–30.0)

## 2020-12-07 LAB — ETHANOL: Alcohol, Ethyl (B): <10 mg/dL (ref <10)

## 2020-12-07 NOTE — ED Triage Notes (Signed)
Pt was brought in by [employer] with IVC paperwork for behavioral disturbances. Pt will not speak, only shake head yes and no. Pt denies SI or HI, but appears tearful.

## 2020-12-08 ENCOUNTER — Emergency Department (HOSPITAL_COMMUNITY): Payer: Medicare Other

## 2020-12-08 DIAGNOSIS — R000 Tachycardia, unspecified: Secondary | ICD-10-CM | POA: Diagnosis not present

## 2020-12-08 DIAGNOSIS — J984 Other disorders of lung: Secondary | ICD-10-CM | POA: Diagnosis not present

## 2020-12-08 DIAGNOSIS — F250 Schizoaffective disorder, bipolar type: Secondary | ICD-10-CM | POA: Diagnosis not present

## 2020-12-08 DIAGNOSIS — D751 Secondary polycythemia: Secondary | ICD-10-CM | POA: Diagnosis not present

## 2020-12-08 LAB — RESP PANEL BY RT-PCR (FLU A&B, COVID) ARPGX2
Influenza A by PCR: NEGATIVE
Influenza B by PCR: NEGATIVE
SARS Coronavirus 2 by RT PCR: NEGATIVE

## 2020-12-08 MED ORDER — BUDESONIDE 0.25 MG/2ML IN SUSP
0.2500 mg | Freq: Two times a day (BID) | RESPIRATORY_TRACT | Status: DC
  Administered 2020-12-08 – 2020-12-17 (×13): 0.25 mg via RESPIRATORY_TRACT
  Filled 2020-12-08 (×14): qty 2

## 2020-12-08 MED ORDER — SODIUM CHLORIDE 0.9 % IV BOLUS (SEPSIS): 1000.0000 mL | Freq: Once | INTRAVENOUS | Status: DC

## 2020-12-08 MED ORDER — TRAZODONE HCL 50 MG PO TABS
50.0000 mg | ORAL_TABLET | Freq: Every day | ORAL | Status: DC
  Administered 2020-12-08 – 2020-12-09 (×2): 50 mg via ORAL
  Filled 2020-12-08 (×2): qty 1

## 2020-12-08 MED ORDER — FLUTICASONE PROPIONATE 50 MCG/ACT NA SUSP
2.0000 | Freq: Every day | NASAL | Status: DC
  Administered 2020-12-08 – 2020-12-17 (×8): 2 via NASAL
  Filled 2020-12-08 (×3): qty 16

## 2020-12-08 MED ORDER — SODIUM CHLORIDE 0.9 % IV SOLN: 1000.0000 mL | INTRAVENOUS | Status: DC

## 2020-12-08 MED ORDER — LORAZEPAM 1 MG PO TABS: 1.0000 mg | ORAL_TABLET | Freq: Four times a day (QID) | ORAL | Status: DC | PRN

## 2020-12-08 MED ORDER — OLANZAPINE 5 MG PO TBDP
5.0000 mg | ORAL_TABLET | Freq: Every day | ORAL | Status: DC | PRN
  Administered 2020-12-09 – 2020-12-15 (×2): 5 mg via ORAL
  Filled 2020-12-08 (×3): qty 1

## 2020-12-08 MED ORDER — LORATADINE 10 MG PO TABS
10.0000 mg | ORAL_TABLET | Freq: Every day | ORAL | Status: DC
  Administered 2020-12-08 – 2020-12-17 (×8): 10 mg via ORAL
  Filled 2020-12-08 (×8): qty 1

## 2020-12-08 MED ORDER — ALBUTEROL SULFATE HFA 108 (90 BASE) MCG/ACT IN AERS
2.0000 | INHALATION_SPRAY | RESPIRATORY_TRACT | Status: DC | PRN
  Administered 2020-12-09 – 2020-12-14 (×4): 2 via RESPIRATORY_TRACT
  Filled 2020-12-08 (×3): qty 6.7

## 2020-12-08 MED ORDER — RISPERIDONE 3 MG PO TABS
4.0000 mg | ORAL_TABLET | Freq: Two times a day (BID) | ORAL | Status: DC
  Administered 2020-12-08 – 2020-12-17 (×16): 4 mg via ORAL
  Filled 2020-12-08 (×16): qty 1

## 2020-12-08 MED ORDER — STERILE WATER FOR INJECTION IJ SOLN
INTRAMUSCULAR | Status: AC
  Filled 2020-12-08: qty 10

## 2020-12-08 MED ORDER — ZIPRASIDONE MESYLATE 20 MG IM SOLR
10.0000 mg | Freq: Once | INTRAMUSCULAR | Status: AC | PRN
  Administered 2020-12-08: 10 mg via INTRAMUSCULAR
  Filled 2020-12-08: qty 20
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
## 2020-12-08 NOTE — ED Notes (Signed)
Pt having TTS consult 
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
## 2020-12-08 NOTE — ED Notes (Addendum)
Pt received breakfast tray. Sitter at bedside. 

## 2020-12-08 NOTE — ED Notes (Signed)
Pt educated to keep 3 liters nasal cannula on at this time, pt eating dinner tray with no distress. Other vss.

## 2020-12-08 NOTE — BH Assessment (Signed)
DISPOSITION: Gave clinical report to Marciano Sequin, NP, who determined patient meets criteria for inpatient psychiatric treatment. Per Rona Ravens, [facility] at [facility], patient is excluded from admission at [facility] due to patient need for continuous oxygen secondary to hypoxia related to COPD and patient desats below 90% on room air. Other facilities will be contacted for placement. TTS notified Heide Guile, RN and Dr. Drema Pry through secure chat of disposition.

## 2020-12-08 NOTE — Progress Notes (Signed)
Per Darcey Nora, [facility] note - "Per Tessie Eke at [facility] is excluded from admission at [facility] due to patient need for continuous oxygen secondary to hypoxia related to COPD and patient desats below 90% on room air. Other facilities will be contacted for placement. TTS notified Heide Guile, RN and Dr. Drema Pry through secure chat of disposition."  Pt. meets criteria for inpatient treatment per Marciano Sequin, NP. Referred out to the following hospitals:   Destination  Service Provider Address Phone Fax  [facility]  [address] [place] [address] [phone]  [facility]  [address] [place] [address] [phone]  [facility] [address]  [address] [place] [address] [phone]  [facility] [place]  [address] [place], [place] [address] [phone]  [facility]  [address] [place] [address] [phone]  [facility]  [address] [place] [address] [phone]  [facility]  [address] [place], [place] [address] [phone]  [facility] [address] [place] [address] [phone]  [facility]  [address] [place] [address] [phone]  [facility]  [address] [place] [address] [phone]  [facility]  [address] [place] [address] [phone]  [place]  [address] [place], [place] [address] [phone]  [facility]  [address] [place] [address] [phone]  [facility]  [address] [place] [address] [phone]  [facility]  [address], [place] [address] [phone]  [facility]  [address], [place] [address] [phone]  [facility]  [address] [place] [address] [phone]  [facility]  [address] [place] [address] [phone]  [facility]  [address] [place] [address] [phone]  [facility]  [address], [place] [address] [phone]  [facility]  [address] [place] [address] [phone]  [facility]  [address], [place] [address] [phone]  [facility]  [address], [place] [address] [phone]  [facility]  [address], [place] [address] [phone]  [facility]  [address] [place] [address] [phone]  [facility]  [address] [place] [address] [phone]  [facility]  [address] [place] [address] [phone]      Disposition CSW will continue to follow for placement.  Signed:  Corky Crafts, MSW, [place], LCASA 12/08/2020 1:01 PM

## 2020-12-08 NOTE — ED Notes (Signed)
2 L O2 applied for sats at 88%; pt has noted hx of hypoxia and breathing issues; Pt was unable to state if she normally wears oxygen at home. Pt sitting in bed and has removed gown; pt rocking back and forth as if she is in meditation. When asked about her breathing issues patient states yes,and then "I don't know". Pt became slightly agitated when asked questions-Monique,RN

## 2020-12-08 NOTE — Care Management (Addendum)
Patient accepted to [facility] on 12-09-2020 after 8am.  Dr. Estill Cotta is the accepting.  [phone].  Writer informed the ER RN of the patient disposition.

## 2020-12-08 NOTE — ED Notes (Signed)
Family at bedside. 

## 2020-12-08 NOTE — ED Notes (Signed)
Pt came out of room asking to call her family; pt allowed to make phone call and starts yelling in the phone and tell unknown person to come pick her up and that she does not know how she got here; pt then states to this nurse that she does not like black people and I am not her family; pt yelling in hallway demanding to call her father to let him know she is here; Pt would not stop yelling at staff and attempted to stand in hallway with no mask on; Security called to unit and patient given [place] by another RN. TTS machine set up in room for [facility] assessment by request from [facility]

## 2020-12-09 DIAGNOSIS — J984 Other disorders of lung: Secondary | ICD-10-CM | POA: Diagnosis not present

## 2020-12-09 DIAGNOSIS — F250 Schizoaffective disorder, bipolar type: Secondary | ICD-10-CM | POA: Diagnosis not present

## 2020-12-09 DIAGNOSIS — D751 Secondary polycythemia: Secondary | ICD-10-CM | POA: Diagnosis not present

## 2020-12-09 MED ORDER — STERILE WATER FOR INJECTION IJ SOLN
INTRAMUSCULAR | Status: AC
  Administered 2020-12-09: 1 mL via INTRAMUSCULAR
  Filled 2020-12-09: qty 10

## 2020-12-09 MED ORDER — ZIPRASIDONE MESYLATE 20 MG IM SOLR
10.0000 mg | Freq: Once | INTRAMUSCULAR | Status: AC | PRN
  Administered 2020-12-09: 10 mg via INTRAMUSCULAR
  Filled 2020-12-09: qty 20

## 2020-12-09 NOTE — ED Notes (Signed)
Lunch Tray Ordered @ 1001. 

## 2020-12-09 NOTE — ED Notes (Signed)
Pt took all clothing off, sitting with back to door and rocking in bed. Pt is agitated and appears to be with visual hallucination

## 2020-12-09 NOTE — ED Notes (Signed)
Dinner Tray Ordered @ 1707. 

## 2020-12-09 NOTE — ED Provider Notes (Signed)
Emergency Medicine Observation Re-evaluation Note  Tonya Gill is a 45 y.o. female, seen on rounds today.  Pt initially presented to the ED for complaints of Behavior Problem Currently, the patient is resting comfortably does not appear in acute distress.  Patient cleared by previous provider, she does have noted elevated hemoglobin 22, this appears to be at baseline for patient.    Physical Exam  BP 117/70 (BP Location: Right Arm)   Pulse 96   Temp (!) 97.4 F (36.3 C) (Oral)   Resp 18   SpO2 (!) 89%  Physical Exam General: Resting comfortably does not appear in acute distress Lungs: Nonlabored breathing, currently on 2 L via nasal cannula Psych: Responding appropriately.  ED Course / MDM  EKG:EKG Interpretation  Date/Time:  Thursday December 08 2020 11:38:50 EST Ventricular Rate:  117 PR Interval:  150 QRS Duration: 80 QT Interval:  340 QTC Calculation: 474 R Axis:   118 Text Interpretation: Sinus tachycardia Left posterior fascicular block Anterior infarct , age undetermined Abnormal ECG When compared with ECG of EARLIER SAME DATE No significant change was found Confirmed by Dione Booze ([id number]) on 12/09/2020 6:15:05 AM    I have reviewed the labs performed to date as well as medications administered while in observation. no  Recent changes in the last 24 hours.  Plan  Current plan is for patient will be admitted to [facility] under Dr. Jeannene Patella. Patient is under full IVC at this time.   Carroll Sage, PA-C 12/09/20 1013    Tegeler, Canary Brim, MD 12/09/20 [phone]

## 2020-12-09 NOTE — ED Notes (Signed)
Pt out of room yelling at staff, stating she is going to leave, want to call her dad to come and get her

## 2020-12-09 NOTE — ED Notes (Signed)
Called [place] to give report. Report given to Terrilyn Saver, unaware that pt requires oxygen and now has to speak with admission coordinator.

## 2020-12-09 NOTE — ED Notes (Signed)
Pt sitting up in bed rocking, will not answer questions and looking straight ahead

## 2020-12-09 NOTE — ED Notes (Signed)
Pt out of room yelling at staff, pt redirected

## 2020-12-09 NOTE — ED Notes (Signed)
Tonya Gill from [facility] called back, unable to admit pt due to  pt using nasal canula and oxygen.  Case management and social worker aware.

## 2020-12-09 NOTE — ED Notes (Signed)
Pt given food and a drink. Pt calm and cooperative at this time.

## 2020-12-09 NOTE — ED Notes (Signed)
Pt is screaming at staff standing at nurses station, pt redirected to room with explanation of appropriate behavior

## 2020-12-09 NOTE — ED Notes (Signed)
Pt laughing in room, sitting on bed, looking at wall

## 2020-12-09 NOTE — Progress Notes (Signed)
  Per Darcey Nora, [facility] note - "Per Tessie Eke at [facility] is excluded from admission at [facility] due to patient need for continuous oxygen secondary to hypoxia related to COPD and patient desats below 90% on room air. Other facilities will be contacted for placement.TTS notified Heide Guile, RN and Dr. Drema Pry through secure chat of disposition."  Patient referred out again at this time.   Signed:  Corky Crafts, MSW, [place], LCASA 12/09/2020 2:47 PM

## 2020-12-10 DIAGNOSIS — F250 Schizoaffective disorder, bipolar type: Secondary | ICD-10-CM | POA: Diagnosis not present

## 2020-12-10 NOTE — BH Assessment (Signed)
Clinician called the Tele-Assessment machine in an effort to complete pt's re-assessment to determine if pt continues to meet inpatient criteria. When the Tele-Assessment machine was answered, clinician said "hi" to pt and she said "hi" in return. Clinician introduced herself and asked pt to identify herself. Pt looked down and did not respond. Clinician asked a variety of questions, including why she was in the hospital, if she had eaten lunch, and if she had any pets at home, but pt would not answer any questions and continued to look down. Hospital staff inquired as to whether pt was going to participate in the re-assessment and pt shook her head "no."  This information was provided to NP Ophelia Shoulder who determined pt continues to meet inpatient criteria. This information was provided to pt's nurse and EDP at 1247.

## 2020-12-10 NOTE — BH Assessment (Signed)
DISPOSITION:  Ophelia Shoulder, NP, determined pt continues to meet inpatient criteria. This information was provided to pt's nurse and EDP at 1247.

## 2020-12-11 DIAGNOSIS — J984 Other disorders of lung: Secondary | ICD-10-CM | POA: Diagnosis not present

## 2020-12-11 DIAGNOSIS — F250 Schizoaffective disorder, bipolar type: Secondary | ICD-10-CM | POA: Diagnosis not present

## 2020-12-11 DIAGNOSIS — D751 Secondary polycythemia: Secondary | ICD-10-CM | POA: Diagnosis not present

## 2020-12-11 NOTE — Progress Notes (Signed)
Per Ophelia Shoulder, NP patient meets criteria for inpatient treatment. There are no available or appropriate beds at [facility] today. CSW faxed referrals to the following facilities for review:  [facility]  TTS will continue to seek bed placement.  Crissie Reese, MSW, LCSW-A, LCAS-A Phone: [phone] Disposition/TOC

## 2020-12-11 NOTE — ED Provider Notes (Signed)
Emergency Medicine Observation Re-evaluation Note  Tonya Gill is a 45 y.o. female, seen on rounds today.  Pt initially presented to the ED for complaints of Behavior Problem Currently, the patient is rest comfortably in bed. No complaints at this time. Patient awaiting bed placement. Patient currently under IVC for responding to internal stimuli and aggressive behavior.   Physical Exam  BP 118/72 (BP Location: Left Arm)   Pulse 74   Temp 98.6 F (37 C) (Oral)   Resp 20   SpO2 94%  Physical Exam General: resting comfortably in bed. No acute distress Cardiac: normal heart sounds Lungs: nonlabored breathing.  Psych: responding appropriately  ED Course / MDM  EKG:EKG Interpretation  Date/Time:  Thursday December 08 2020 11:38:50 EST Ventricular Rate:  117 PR Interval:  150 QRS Duration: 80 QT Interval:  340 QTC Calculation: 474 R Axis:   118 Text Interpretation: Sinus tachycardia Left posterior fascicular block Anterior infarct , age undetermined Abnormal ECG When compared with ECG of EARLIER SAME DATE No significant change was found Confirmed by Dione Booze ([id number]) on 12/09/2020 6:15:05 AM    I have reviewed the labs performed to date as well as medications administered while in observation.  Recent changes in the last 24 hours include none  Plan  Current plan is for patient meets inpatient criteria. She is awaiting bed placement Patient is under full IVC at this time.   Mannie Stabile, PA-C 12/11/20 6967    Linwood Dibbles, MD 12/12/20 [phone]

## 2020-12-11 NOTE — ED Notes (Signed)
Pt refusing to wake up for this RN. Normal chest rise and fall , NAD. This RN placed pt on 3L [place] which pt is supposed to regularly be on but is usually non compliant.
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
## 2020-12-11 NOTE — ED Notes (Signed)
Pt sleeping and not cooperating for vitals at this time - will obtain vitals at a later time

## 2020-12-11 NOTE — BH Assessment (Signed)
Per Ophelia Shoulder, NP patient continues to meet criteria for inpatient treatment. There are no appropriate beds at [facility] today. CSW faxed referrals to the following facilities for review:   TTS reassessment: Patient is oriented to person and place, her speech is clear, eye contact is normal, thoughts are disorganized, and she presents calm initially but becomes agitated as reassessment continues. Patient does not know reason for ED visit and denies report from IVC. Patient recalls being home looking at television and then recalls being at the ED for unknown reasons. Patient denies SI/HI/AH. Patient reports VH of "my mind playing tricks on me. I see shadows at night". Patient reports living at home with "a white man that is my roommate" and calls him Mr. Faylene Million who she reports is a friend and does not identify him as her stepfather. Patient reports that she has been living with him for about 21 years. Patient states that Mr. Faylene Million agreed to take care of her due to her being an orphan and not knowing her parents. Patient reports wanting to move into a group home to be more independent and because Mr. Faylene Million complains about her not cleaning, exercising or paying rent. Patient denies having outpatient services but is willing to have them. Patient denies history of psychiatric hospitalization. Patient reports that she is ready to leave. Per chart review patient continues to have aggressive behaviors and disorganized thoughts.

## 2020-12-12 DIAGNOSIS — J984 Other disorders of lung: Secondary | ICD-10-CM | POA: Diagnosis not present

## 2020-12-12 DIAGNOSIS — F250 Schizoaffective disorder, bipolar type: Secondary | ICD-10-CM | POA: Diagnosis not present

## 2020-12-12 DIAGNOSIS — D751 Secondary polycythemia: Secondary | ICD-10-CM | POA: Diagnosis not present

## 2020-12-12 NOTE — Consult Note (Signed)
                                      Tele-psych consult note  Attempted to evaluate this patient via tele-med. Patient noted lying on the bed with eyes open. The nursing staff reported that she had taken her medication and eaten breakfast. However, despite multiple prompts she would not answer any questions asked by this Clinical research associate. Recommend to continue inpatient psychiatric admission due to suspected psychosis. Per nursing staff patient is taking her scheduled risperdal.    12/12/2020 Tonya Gill PMHNP-C

## 2020-12-12 NOTE — ED Notes (Signed)
Pt refused to have vitals taken after being asked multiple times.

## 2020-12-12 NOTE — ED Notes (Signed)
TTS at bedside, pt refuses to speak to provider. Pt's eyes are open but pt will not speak. Pt ate all her breakfast on own and moved herself and repositioned herself in the bed on her own.

## 2020-12-12 NOTE — BH Assessment (Signed)
Raynelle Fanning from [facility] contacted staff to see if pt still needs placement. Raynelle Fanning to give referral for possible placement.    Redmond Pulling, MS, [facility], [facility] Triage Specialist [phone]

## 2020-12-12 NOTE — ED Provider Notes (Signed)
Emergency Medicine Observation Re-evaluation Note  Tonya Gill is a 45 y.o. female, seen on rounds today.  Pt initially presented to the ED for complaints of Behavior Problem Currently, the patient is sleeping comfortably in no acute distress.    Physical Exam  BP 120/78 (BP Location: Left Arm)   Pulse 79   Temp 98.1 F (36.7 C) (Oral)   Resp 17   SpO2 92%  Physical Exam General: Resting comfortably no acute distress Cardiac: Heart rate within normal limits at 79 Lungs: No respiratory distress, nonlabored breathing   ED Course / MDM  EKG:EKG Interpretation  Date/Time:  Thursday December 08 2020 11:38:50 EST Ventricular Rate:  117 PR Interval:  150 QRS Duration: 80 QT Interval:  340 QTC Calculation: 474 R Axis:   118 Text Interpretation: Sinus tachycardia Left posterior fascicular block Anterior infarct , age undetermined Abnormal ECG When compared with ECG of EARLIER SAME DATE No significant change was found Confirmed by Dione Booze ([id number]) on 12/09/2020 6:15:05 AM    I have reviewed the labs performed to date as well as medications administered while in observation.  Recent changes in the last 24 hours include none.  Plan  Current plan is for inpatient treatment.  Was reevaluated by Ophelia Shoulder NP on 3/13 continues to meet criteria for inpatient treatment.  Patient is currently awaiting bed.  CSW actively looking for placement for patient.  Patient is under full IVC at this time for responding to internal stimuli and aggressive behavior.   Haskel Schroeder, PA-C 12/12/20 4332    Margarita Grizzle, MD 12/13/20 1452

## 2020-12-12 NOTE — Progress Notes (Signed)
Patient information has been sent to [facility] via secure chat to review for potential admission. Patient meets inpatient criteria per [facility].     Situation ongoing, CSW will continue to monitor progress.        Signed:  Wyvonnia Lora, LCSWA  12/12/2020 10:08 AM

## 2020-12-12 NOTE — ED Notes (Signed)
Pt pleasant and calm, requested and given a diet cola. Pt offered and given peanut butter and crackers for a snack.

## 2020-12-12 NOTE — ED Notes (Signed)
Pt continues to rest on bed with feet at head of bed and head at foot of bed. Pt remains on 2L O2 Tonya Gill, appears to be sleeping, respirations even and unlabored.

## 2020-12-12 NOTE — Progress Notes (Signed)
Per [facility], facility is at capacity due to staffing issues. Patient has been referred out at this time.   Pt. meets criteria for inpatient treatment per Ophelia Shoulder, NP. Referred out to the following hospitals:   Destination Service Provider Address Phone Fax  [facility]  [address] [place] [address] [phone]  [facility]  [address] [place] [address] [phone]  [facility] [address]  [address], [place] [address] [phone]  [facility]  [address] [place], [place] [address] [phone]  [facility]  [address] [place] [address] [phone]  [facility]  [address] [place] [address] [phone]  [facility]  [address] [place], [place] [address] [phone]  [facility] [address] [place] [address] [phone]  [facility]  [address], [place] [address] [phone]  [facility]  [address] [place] [address] [phone]  [facility]  [address] [place] [address] [phone]  [facility]  [address] [place], [place] [address] [phone]  [facility]  [address] [place] [address] [phone]  [facility]  [address] [place] [address] [phone]  [facility]  [address] [place] [address] [phone]  [facility]  [address], [place] [address] [phone]  [facility]  [address] [place] [address] [phone]   [facility]  [address] [place] [address] [phone]  [facility]  [address] [place] [address] [phone]  [facility]  [address], [place] [address] [phone]  [facility]  [address], [place] [address] [phone]  [facility]  [address], [place] [address] [phone]  [facility]  [address], [place] [address] [phone]  [facility]  [address], [place] [address] [phone]  [facility]  [address] [place] [address] [phone]  [facility]  [address] [place] [address] [phone]  [facility]  [address], [place] [address] [phone]     Disposition CSW will continue to follow for placement.   Signed:  Corky Crafts, MSW, [place], LCASA 12/12/2020 2:01 PM

## 2020-12-13 DIAGNOSIS — J984 Other disorders of lung: Secondary | ICD-10-CM | POA: Diagnosis not present

## 2020-12-13 DIAGNOSIS — F250 Schizoaffective disorder, bipolar type: Secondary | ICD-10-CM | POA: Diagnosis not present

## 2020-12-13 DIAGNOSIS — D751 Secondary polycythemia: Secondary | ICD-10-CM | POA: Diagnosis not present

## 2020-12-13 LAB — I-STAT ARTERIAL BLOOD GAS, ED
Acid-Base Excess: 6 mmol/L — ABNORMAL HIGH (ref 0.0–2.0)
Bicarbonate: 34.8 mmol/L — ABNORMAL HIGH (ref 20.0–28.0)
Calcium, Ion: 1.29 mmol/L (ref 1.15–1.40)
HCT: 59 % — ABNORMAL HIGH (ref 36.0–46.0)
Hemoglobin: 20.1 g/dL — ABNORMAL HIGH (ref 12.0–15.0)
O2 Saturation: 86 %
Patient temperature: 97.8
Potassium: 4.1 mmol/L (ref 3.5–5.1)
Sodium: 135 mmol/L (ref 135–145)
TCO2: 37 mmol/L — ABNORMAL HIGH (ref 22–32)
pCO2 arterial: 61.3 mmHg — ABNORMAL HIGH (ref 32.0–48.0)
pH, Arterial: 7.36 (ref 7.350–7.450)
pO2, Arterial: 55 mmHg — ABNORMAL LOW (ref 83.0–108.0)

## 2020-12-13 NOTE — Progress Notes (Signed)
Pt was referred out to the following facilities due to pt needs O2 support.  Destination  Service Provider Address Phone Fax  [facility]  [address], [place] [address] [phone]  [facility] [address]  [address], [place] [address] [phone]  [facility]  [address] [place], [place] [address] [phone]  [facility]  [address], [place] [address] [phone]  [facility]  [address] [place] [address] [phone]  [facility]  [address] [place], [place] [address] [phone]  [facility] [place] [address] [phone]  [facility]  [address] [place] [address] [phone]  [facility]  [address] [place] [address] [phone]  [facility]  [address] [place] [address] [phone]  [facility]  [address] [place], [place] [address] [phone]  [facility]  [address] [place] [address] [phone]  [facility]  [address], [place] [address] [phone]  [facility]  [address], [place] [address] [phone]  [facility]  [address] [place] [address] [phone]  [facility]  [address] [place] [address] [phone]  [facility]  [address] [place] [address] [phone]  [facility]  [address] [place] [address] [phone]  [facility]   [address], [place] [address] [phone]  [facility]  [address] [place], [place] [address] [phone]  [facility]  [address], [place] [address] [phone]  [facility]  [address], [place] [address] [phone]  [facility]  [address] [place] [address] [phone]  [facility]  [address] [place] [address] [phone]  [facility]  [address], [place] [address] [phone]  [facility]  [address] [place] [address] [phone]  [facility]  [address] [place] [address] [phone]  [facility]  [address], [place] [address] [phone]  [facility]  [address] [place] [address] [phone]  [facility]  [address] [place], [place] [address] [phone]  [facility]  [address] [place] [address] [phone]  [facility]  [address] [place] [address] [phone]            Situation ongoing CSW will follow.

## 2020-12-13 NOTE — Progress Notes (Addendum)
Tonya Gill is reviewing pt information for possible admission, Tonya Founds, RN attending nurse will be contacted.

## 2020-12-13 NOTE — Progress Notes (Signed)
Pt was accepted to [facility]  Meets inpatient criteria per Ophelia Shoulder, NP.   The attending physician is Janace Aris, MD  Report can be called to [phone]  Patient is IVC'd.  Patient can be transported by [facility].  [facility] is [facility] to [facility] 12/14/2020 after 10:00 am.  Jamey Ripa, RN notified.

## 2020-12-13 NOTE — ED Provider Notes (Signed)
Emergency Medicine Observation Re-evaluation Note  Tonya Gill is a 45 y.o. female, seen on rounds today.  Pt initially presented to the ED for complaints of Behavior Problem Currently, the patient is sleeping in bed with 3L [place].  Physical Exam  BP 96/64 (BP Location: Left Arm)   Pulse 71   Temp 98.4 F (36.9 C) (Oral)   Resp 18   SpO2 91%  Physical Exam General: obese Lungs: respirations even and unlabored.  ED Course / MDM  EKG:EKG Interpretation  Date/Time:  Thursday December 08 2020 11:38:50 EST Ventricular Rate:  117 PR Interval:  150 QRS Duration: 80 QT Interval:  340 QTC Calculation: 474 R Axis:   118 Text Interpretation: Sinus tachycardia Left posterior fascicular block Anterior infarct , age undetermined Abnormal ECG When compared with ECG of EARLIER SAME DATE No significant change was found Confirmed by Dione Booze ([id number]) on 12/09/2020 6:15:05 AM    I have reviewed the labs performed to date as well as medications administered while in observation.  Recent changes in the last 24 hours include patient is taking her medications however does not participate in psychiatric reevaluation yesterday, would not answer questions.  She was placed on supplemental oxygen at 3 L due to O2 sats in the upper 80s/low 90s.  On further review of her records dating back to at least 2017, has had documented O2 sats of 88 to 90% and is not on supplemental oxygen.  Does have history of asthma, will review medications to make sure her inhaler treatments are ordered. Plan is to wean off supplemental O2.  Plan  Current plan is for placement. Patient is under full IVC at this time.   Jeannie Fend, PA-C 12/13/20 1045    Lorre Nick, MD 12/14/20 [phone]

## 2020-12-13 NOTE — ED Notes (Signed)
Patient c/o of stomach ache; Ginger Rale given-Monique,RN

## 2020-12-14 ENCOUNTER — Encounter (HOSPITAL_COMMUNITY): Payer: Self-pay | Admitting: Radiology

## 2020-12-14 ENCOUNTER — Emergency Department (HOSPITAL_COMMUNITY): Payer: Medicare Other

## 2020-12-14 DIAGNOSIS — R0902 Hypoxemia: Secondary | ICD-10-CM | POA: Diagnosis not present

## 2020-12-14 DIAGNOSIS — J984 Other disorders of lung: Secondary | ICD-10-CM | POA: Diagnosis not present

## 2020-12-14 DIAGNOSIS — F250 Schizoaffective disorder, bipolar type: Secondary | ICD-10-CM | POA: Diagnosis not present

## 2020-12-14 DIAGNOSIS — R7989 Other specified abnormal findings of blood chemistry: Secondary | ICD-10-CM | POA: Diagnosis not present

## 2020-12-14 DIAGNOSIS — D751 Secondary polycythemia: Secondary | ICD-10-CM | POA: Diagnosis not present

## 2020-12-14 DIAGNOSIS — I517 Cardiomegaly: Secondary | ICD-10-CM | POA: Diagnosis not present

## 2020-12-14 LAB — D-DIMER, QUANTITATIVE: D-Dimer, Quant: 0.68 ug/mL-FEU — ABNORMAL HIGH (ref 0.00–0.50)

## 2020-12-14 MED ORDER — IOHEXOL 350 MG/ML SOLN
80.0000 mL | Freq: Once | INTRAVENOUS | Status: AC | PRN
  Administered 2020-12-14: 80 mL via INTRAVENOUS

## 2020-12-14 NOTE — ED Notes (Signed)
Pt refusing to have vitals unable to update red alert on signal

## 2020-12-14 NOTE — Discharge Instructions (Addendum)
It was our pleasure to provide your ER care today - we hope that you feel better.  Continue your medications as prescribed by your doctors.   Follow up with your behavioral health provider in the coming week.   Also follow up closely with your primary care doctor to facilitate pulmonary/lung consultation and follow up. Use your cpap at night.   For mental health issues and/or crisis, you may go directly to the Behavioral Health Urgent Care - it is open 24/7 and walk-ins are welcome.   Return to ER if worse, new symptoms, fevers, chest pain, increased trouble breathing, or other concern.

## 2020-12-14 NOTE — ED Provider Notes (Signed)
Emergency Medicine Observation Re-evaluation Note  Tonya Gill is a 45 y.o. female, seen on rounds today.  Pt initially presented to the ED for complaints of Behavior Problem Currently, the patient is calm, alert, asking for soda.   Physical Exam  BP 128/78 (BP Location: Right Arm)   Pulse 76   Temp 98.5 F (36.9 C) (Oral)   Resp 20   SpO2 (!) 88% Comment: room while ambulating. EDP aware Physical Exam General: alert, content Cardiac: regular rate Lungs: breathing comfortably Psych: alert, content.   ED Course / MDM  EKG:EKG Interpretation  Date/Time:  Thursday December 08 2020 11:38:50 EST Ventricular Rate:  117 PR Interval:  150 QRS Duration: 80 QT Interval:  340 QTC Calculation: 474 R Axis:   118 Text Interpretation: Sinus tachycardia Left posterior fascicular block Anterior infarct , age undetermined Abnormal ECG When compared with ECG of EARLIER SAME DATE No significant change was found Confirmed by Dione Booze ([id number]) on 12/09/2020 6:15:05 AM   I have reviewed the labs performed to date as well as medications administered while in observation.  Recent changes in the last 24 hours include stabilization on meds, BH reassessment.   Plan  [facility], Dr Anabel Halon. RN to facilitate transport.   Note that patient also has chronic lung disease that appears c/w baseline - after acute behavioral health issue/hospitalization, would recommend outpatient follow up with pulmonary medicine.  Earlier CT neg for PE or acute pulmonary process. Pt on 1-2 liters o2 .  Pt is breathing comfortably, and currently appears stable for transfer to [facility].    Cathren Laine, MD 12/14/20 [phone]

## 2020-12-14 NOTE — ED Notes (Signed)
TC to [facility] and spoke with [place] [facility]. Pt was not accepted to [facility] by DR Regino Schultze .

## 2020-12-14 NOTE — ED Notes (Signed)
Pt now awake and verbal hostile and aggressive. Sitter reported Pt blew her nose into her hands and then licked it with her tongue .

## 2020-12-14 NOTE — ED Notes (Signed)
Pt. Refusing to have vital signs taken.

## 2020-12-14 NOTE — ED Notes (Signed)
Pt noted to have SPO2 of 91 %. Pt sleeping deeply and difficult to wake up. Sternal rub  Needed to wake Pt . Staff checked SPO2 resting  and during ambultion. SPO2 88% to 91% . This infromtion was reported to [facility]. . D-dimer was ordered.

## 2020-12-14 NOTE — ED Notes (Signed)
Patient removed her gown and was sitting on the bed naked. Patient was offered a clean gown. Patient accepted clean gown but refused to wear it. Pt blew her nose into her bare hands and then licked her hands "clean". Patient also shouted at Nurse Tech, "Ginger bitch has no soul" and asked this author to "Go fuck yourself".

## 2020-12-14 NOTE — ED Notes (Addendum)
TC to [facility]. Update given on Tonya Gill with Neg CT Scan for PE. Pt has a base line PCO2 range 88-91 %. Admissions RN will talk to director about  Pt and return .

## 2020-12-14 NOTE — BH Assessment (Incomplete Revision)
@   1910: [facility] revokes acceptance due to instable OT states , Disposition Counselor restarted search for placement.   Patient faxed to the following facilities for consideration of placement: [facility] Details [facility] Details [facility] Details [facility] Details [facility] Details [facility] Details [facility] Details [facility] Details [facility] Details [facility] Details [facility] Details [facility] Details [facility] Details [facility] Details [facility] Details [facility] Details [facility] Details [facility] Details [facility] Details [facility] Details [facility] Details [facility] Details [facility] Details [facility] Details [facility] Details [facility] Details [facility] Details [facility] Details [facility] Details [facility]

## 2020-12-14 NOTE — BH Assessment (Addendum)
@   1910: [facility] revokes acceptance due to instable OT states , CSW to restart search for placement.

## 2020-12-14 NOTE — ED Notes (Signed)
Pt woke up to eat morning meal. Pt does not communacate with staff verbally .

## 2020-12-14 NOTE — ED Notes (Signed)
Return call from [facility] and DR Regino Schultze declined PT due to medical codition

## 2020-12-14 NOTE — ED Notes (Signed)
Pt refuses vital signs and staff unable to update red color on Vital sign section.

## 2020-12-14 NOTE — ED Notes (Signed)
Sitter in room with Pt observed a red object in Pt's RT ear canal . As staff attempted to look closer at object in Pt ear ,Pt became agitated and covered Rt ear with her hand. Pt would not allow staff to look at her ear.
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
## 2020-12-14 NOTE — ED Notes (Signed)
Pt sleeping. 

## 2020-12-14 NOTE — ED Notes (Signed)
Vernona Rieger ,PA  In room to assess red object in Pt ear but Pt was verbally hostile to staff. At this time staff is unable to eval. Red object in Pt Rt ear.

## 2020-12-14 NOTE — ED Notes (Signed)
Pt excessively loud and shouting .Marland Kitchen Pt is talking to people who are not in room.

## 2020-12-15 DIAGNOSIS — D751 Secondary polycythemia: Secondary | ICD-10-CM | POA: Diagnosis not present

## 2020-12-15 DIAGNOSIS — F250 Schizoaffective disorder, bipolar type: Secondary | ICD-10-CM | POA: Diagnosis not present

## 2020-12-15 DIAGNOSIS — J984 Other disorders of lung: Secondary | ICD-10-CM | POA: Diagnosis not present

## 2020-12-15 NOTE — Progress Notes (Addendum)
Patient given lunch tray. Loud, paranoid, verbally aggressive. Patient refuses to wear hospital gown. Patient took meds without incident. Remains on 1:1 observation for safety.

## 2020-12-15 NOTE — ED Notes (Signed)
Pt given breakfast tray

## 2020-12-15 NOTE — Progress Notes (Signed)
Patient sleeping quietly

## 2020-12-15 NOTE — ED Notes (Signed)
Pt given a snack 

## 2020-12-15 NOTE — ED Provider Notes (Signed)
Emergency Medicine Observation Re-evaluation Note  Tonya Gill is a 45 y.o. female, seen on rounds today.  Pt initially presented to the ED for complaints of Behavior Problem Currently, the patient is sleeping, but has exhibited labile, aggressive behavior during the past day.  Physical Exam  BP 111/82   Pulse 76   Temp 97.8 F (36.6 C) (Axillary)   Resp 18   SpO2 (!) 89%  Physical Exam General: Obese female sleeping currently in no distress Cardiac: No tachycardia Lungs: No increased work of breathing Psych: Sleeping, but in discussing her last 24 hours with nursing staff she has had aggression towards staff, seemingly little insight into her presentation.  ED Course / MDM  EKG:EKG Interpretation  Date/Time:  Thursday December 08 2020 11:38:50 EST Ventricular Rate:  117 PR Interval:  150 QRS Duration: 80 QT Interval:  340 QTC Calculation: 474 R Axis:   118 Text Interpretation: Sinus tachycardia Left posterior fascicular block Anterior infarct , age undetermined Abnormal ECG When compared with ECG of EARLIER SAME DATE No significant change was found Confirmed by Dione Booze ([id number]) on 12/09/2020 6:15:05 AM   I have reviewed the labs performed to date as well as medications administered while in observation.  Recent changes in the last 24 hours include complications with IVC status.  Patient has severe behavioral disturbances violent behavior towards family, interacts with hallucinations.  IVC papers resubmitted  Plan  Current plan is for inpatient hospitalization. Patient is under full IVC at this time.  Her work submitted as her initial IVC papers expired.   Gerhard Munch, MD 12/15/20 [phone]

## 2020-12-15 NOTE — ED Provider Notes (Signed)
Patient received her nighttime respiradal.  She was noted to be walking around the room and talking out to things that were not there.  For this reason, she was given her as needed Zyprexa.  Received IVC paperwork from the secretary that have been rejected by the magistrate.  Per Diplomatic Services operational officer, this was extension IVC paperwork from her original IVC.  It was filled out by Dr. Renaye Rakers.  Per magistrate, there needed to be additional detail.  IVC paperwork read "severe behavioral disturbances, violent behavior towards family, smoking marijuana."  Given that she continues to respond to internal stimuli, I added " patient is acutely psychotic and responding to internal stimulus.  She is a danger to herself and others."  Again, the magistrate declined to certify IVC paperwork.  I had a long discussion with the magistrate on the phone.  They were requesting specific examples of her violent behavior.  Patient has been here over 174 hours.  She is not currently violent or agitated.  I discussed with them that this was based on her initial presentation which was approximately a week or so ago.  I also expressed frustration that this is language use similarly on the majority of IVC paperwork filled out in the emergency department for similar sorts of instances.  The magistrate overnight, reiterated that given the legality of IVC, they needed more detail.  I requested that they feed back to their superior that the IVC process seems inconsistent for magistrate to magistrate which is a source of frustration for those of [place] dealing with these patients on a day-to-day basis.  Additionally, I am unsure why the IVC paperwork needed extension with the exception of the patient being transferred to a psychiatric facility.  I involved the psychiatry NP.  I reviewed my discussion with the magistrate with him as well as the patient.  Given that the patient is not attempting to leave at this time, he recommends not filling out IVC paperwork.   Unfortunately, patient will not be going to [facility] because of her medical problems and oxygen requirement.  Physical Exam  BP (!) 144/84 (BP Location: Left Arm)   Pulse 63   Temp 99.1 F (37.3 C) (Oral)   Resp 18   SpO2 96%        Shon Baton, MD 12/15/20 [phone]

## 2020-12-16 DIAGNOSIS — F250 Schizoaffective disorder, bipolar type: Secondary | ICD-10-CM | POA: Diagnosis not present

## 2020-12-16 NOTE — ED Notes (Signed)
Pt OOB walking to the bathroom, no distress noted.

## 2020-12-16 NOTE — Progress Notes (Addendum)
Patient information has been sent to [facility] via secure chat to review for potential admission. Patient meets inpatient criteria per Ophelia Shoulder, NP.   Situation ongoing, CSW will continue to monitor progress.   Patient was last referred out to all inpt BH facilities on this day @ 1233.   Signed:  Corky Crafts, MSW, LCSWA, LCASA 12/16/2020 10:20 AM

## 2020-12-16 NOTE — ED Provider Notes (Signed)
Emergency Medicine Observation Re-evaluation Note  Tonya Gill is a 45 y.o. female, seen on rounds today.  Pt initially presented to the ED for complaints of Behavior Problem Currently, the patient is sleeping, awakens easily, has labile behavior.  Physical Exam  BP 126/84 (BP Location: Right Arm)   Pulse 92   Temp 98 F (36.7 C) (Oral)   Resp 18   SpO2 91%  Physical Exam General: No distress, sleeping Cardiac: No tachycardia Lungs: No increased work of breathing Psych: Labile behavior when awake  ED Course / MDM  EKG:EKG Interpretation  Date/Time:  Thursday December 08 2020 11:38:50 EST Ventricular Rate:  117 PR Interval:  150 QRS Duration: 80 QT Interval:  340 QTC Calculation: 474 R Axis:   118 Text Interpretation: Sinus tachycardia Left posterior fascicular block Anterior infarct , age undetermined Abnormal ECG When compared with ECG of EARLIER SAME DATE No significant change was found Confirmed by Dione Booze ([id number]) on 12/09/2020 6:15:05 AM   I have reviewed the labs performed to date as well as medications administered while in observation.  Recent changes in the last 24 hours include none, awaiting placemen.  Plan  Current plan is for inpatient psych hospitalization.  Some delay secondary to the patient's history of lung disease. Patient is under full IVC at this time.   Gerhard Munch, MD 12/16/20 1341

## 2020-12-17 DIAGNOSIS — F23 Brief psychotic disorder: Secondary | ICD-10-CM

## 2020-12-17 DIAGNOSIS — F250 Schizoaffective disorder, bipolar type: Secondary | ICD-10-CM
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
## 2020-12-17 NOTE — ED Notes (Signed)
Pt talking to TTS. Pt sat up and lights turned on. Will continue to monitor pt.

## 2020-12-17 NOTE — ED Provider Notes (Signed)
BH team has reassessed and indicates pt is improved, and psych cleared for discharge.   Pt is agreeable with that plan, and is caregiver/assistant.   TOC consulted for home health services, and verified pt has f/u plan with [facility].    Pt's vital signs are normal, she is breathing comfortably. Pt has normal mood/affect. No SI.   Pt currently appears stable for d/c.   Return precautions provided.    Cathren Laine, MD 12/17/20 [phone]

## 2020-12-17 NOTE — ED Provider Notes (Signed)
Emergency Medicine Observation Re-evaluation Note  Tonya Gill is a 45 y.o. female, seen on rounds today.  Pt initially presented to the ED for complaints of Behavior Problem Currently, the patient is sitting in her bed, changing her gown.  Physical Exam  BP 102/64   Pulse 72   Temp 98.5 F (36.9 C) (Oral)   Resp 17   SpO2 93%  Physical Exam General: Sitting in the bed, changing her gown Lungs: No increased work of breathing Psych: Calm  ED Course / MDM  EKG:EKG Interpretation  Date/Time:  Thursday December 08 2020 11:38:50 EST Ventricular Rate:  117 PR Interval:  150 QRS Duration: 80 QT Interval:  340 QTC Calculation: 474 R Axis:   118 Text Interpretation: Sinus tachycardia Left posterior fascicular block Anterior infarct , age undetermined Abnormal ECG When compared with ECG of EARLIER SAME DATE No significant change was found Confirmed by Dione Booze ([id number]) on 12/09/2020 6:15:05 AM   I have reviewed the labs performed to date as well as medications administered while in observation.  Recent changes in the last 24 hours include none.  Plan  Current plan is for inpatient psychiatric management. Patient is under full IVC at this time.   Fayrene Helper, PA-C 12/17/20 0354    Cathren Laine, MD 12/18/20 [phone]

## 2020-12-17 NOTE — ED Notes (Signed)
Pt.s breakfast has arrived. Pt sat up and eating breakfast now. Will continue to monitor pt.

## 2020-12-17 NOTE — TOC Initial Note (Addendum)
Transition of Care [facility]) - Initial/Assessment Note    Patient Details  Name: Tonya Gill MRN: [id number] Date of Birth: 1975-10-02  Transition of Care [facility] CM/SW Contact:    Oretha Milch, LCSW Phone Number: 12/17/2020, 12:14 PM  Clinical Narrative: TOC consulted due to TTS psych clearing patient and needing to follow-up with transition home and with home health. CSW informed RNCM regarding home health o2 and CPAP needs. CSW met with patient and noted patient had not been informed they were clear and has no current safety plan or follow-up plan. Patient did report they have an outpatient provider with [facility] with her next follow-up appointment in a few weeks (specific date unknown) and her roommate/ex-stepfather would be staying with her for safety as he is retired. Patient reported she did feel safe returning home and did provide verbal consent for [facility] team to look at home health follow-up, to contact emergency contact, and to reach out to [facility] (unable to schedule follow-up on the weekend unfortunately). TOC updated physician Dr. Ashok Cordia. CSW left voicemail with emergency contact awaiting return call.         12:35p: CSW left voicemail with [facility] with request to reach out to patient for post-hospitalization follow-up. CSW spoke with Dwaine Deter and confirmed he will be able to pick patient up at 1:30p-2p. CSW noted he asked if patient received her injection and reports he is unsure of what injection but it has been past thirty days (date unknown). CSW unable to confirm most recent injection due to outpatient psychiatry office being closed on weekends. CSW noted in chart it is likely the invega sustenna injection but did not see anything in chart saying she received it here. CSW inquired attending physicians and RNCM for input.    Expected Discharge Plan: [place] Barriers to Discharge: ED Transportation   Patient Goals and CMS  Choice        Expected Discharge Plan and Services Expected Discharge Plan: [place]                                              Prior Living Arrangements/Services       Do you feel safe going back to the place where you live?: Yes               Activities of Daily Living      Permission Sought/Granted Permission sought to share information with : Case Manager Permission granted to share information with : Yes, Verbal Permission Granted              Emotional Assessment Appearance:: Appears older than stated age Attitude/Demeanor/Rapport: Engaged Affect (typically observed): Appropriate,Calm Orientation: : Oriented to Self,Oriented to Place,Oriented to  Time,Oriented to Situation Alcohol / Substance Use: Not Applicable Psych Involvement: No (comment)  Admission diagnosis:  IVC Patient Active Problem List   Diagnosis Date Noted  . Sinusitis, chronic 11/03/2014  . Restrictive lung disease secondary to obesity 11/03/2014  . Stress incontinence in female 11/03/2014  . Hypoxemia 07/14/2014  . Pain of right deltoid 07/08/2014  . Chest congestion 04/15/2014  . Asthma, persistent 05/04/2011  . ALCOHOL ABUSE 12/14/2009  . OSTEOARTHRITIS, MULTIPLE JOINTS 12/14/2009  . OBESITY 03/11/2009  . Schizoaffective disorder ([place]) 03/11/2009  . TOBACCO ABUSE 03/11/2009  . Sleep apnea 03/11/2009  PCP:  Matilde Haymaker, MD Pharmacy:   [facility] [place] [address] Phone: [phone] Fax: [phone]  [facility] [phone] - [place], [place] - [address] [place] [address] Phone: [phone] Fax: [phone]     Social Determinants of Health (SDOH) Interventions    Readmission Risk Interventions No flowsheet data found.

## 2020-12-17 NOTE — ED Notes (Signed)
Patient is TTS NOW

## 2020-12-17 NOTE — ED Notes (Signed)
Pt's lunch has arrived. Pt sitting up and eating her lunch. Will continue to monitor pt.  

## 2020-12-19 ENCOUNTER — Other Ambulatory Visit: Payer: Self-pay | Admitting: Family Medicine

## 2020-12-19 DIAGNOSIS — J4541 Moderate persistent asthma with (acute) exacerbation: Secondary | ICD-10-CM

## 2020-12-27 ENCOUNTER — Telehealth: Payer: Self-pay

## 2020-12-27 NOTE — Telephone Encounter (Signed)
Received fax from pharmacy, PA needed on Pulmicort. Clinical questions submitted via Cover My Meds. Waiting on response, could take up to 72 hours.  Cover My Meds info: Key: [id number]

## 2021-01-03 ENCOUNTER — Other Ambulatory Visit: Payer: Self-pay | Admitting: Family Medicine

## 2021-01-03 MED ORDER — FLOVENT HFA 44 MCG/ACT IN AERO: 2.0000 | INHALATION_SPRAY | Freq: Every day | RESPIRATORY_TRACT | 3 refills | Status: DC

## 2021-01-03 NOTE — Telephone Encounter (Signed)
Medication was denied. The patient needs to try and fail Flovent Diskus or Flovent HFA.  Please advise.

## 2021-01-03 NOTE — Telephone Encounter (Signed)
Flovent sent to pharmacy.

## 2021-02-21 ENCOUNTER — Other Ambulatory Visit: Payer: Self-pay | Admitting: Family Medicine

## 2021-05-22 ENCOUNTER — Other Ambulatory Visit: Payer: Self-pay | Admitting: Family Medicine

## 2021-06-08 ENCOUNTER — Other Ambulatory Visit: Payer: Self-pay | Admitting: Family Medicine

## 2021-07-13 ENCOUNTER — Other Ambulatory Visit: Payer: Self-pay | Admitting: Family Medicine

## 2021-07-18 ENCOUNTER — Other Ambulatory Visit (HOSPITAL_COMMUNITY)
Admission: RE | Admit: 2021-07-18 | Discharge: 2021-07-18 | Disposition: A | Discharge status: Home / Self Care | Payer: Medicare Other | Source: Ambulatory Visit | Attending: Family Medicine | Admitting: Family Medicine

## 2021-07-18 ENCOUNTER — Ambulatory Visit (INDEPENDENT_AMBULATORY_CARE_PROVIDER_SITE_OTHER): Payer: Medicare Other | Admitting: Family Medicine

## 2021-07-18 ENCOUNTER — Other Ambulatory Visit: Payer: Self-pay

## 2021-07-18 VITALS — BP 104/71 | HR 104 | Ht 66.5 in | Wt 306.4 lb

## 2021-07-18 DIAGNOSIS — R102 Pelvic and perineal pain: Secondary | ICD-10-CM | POA: Insufficient documentation

## 2021-07-18 DIAGNOSIS — Z124 Encounter for screening for malignant neoplasm of cervix: Secondary | ICD-10-CM

## 2021-07-18 DIAGNOSIS — G4733 Obstructive sleep apnea (adult) (pediatric): Secondary | ICD-10-CM

## 2021-07-18 DIAGNOSIS — R0902 Hypoxemia: Secondary | ICD-10-CM | POA: Diagnosis not present

## 2021-07-18 DIAGNOSIS — Z1151 Encounter for screening for human papillomavirus (HPV): Secondary | ICD-10-CM | POA: Insufficient documentation

## 2021-07-18 DIAGNOSIS — J984 Other disorders of lung: Secondary | ICD-10-CM

## 2021-07-18 DIAGNOSIS — E669 Obesity, unspecified: Secondary | ICD-10-CM

## 2021-07-18 DIAGNOSIS — Z01419 Encounter for gynecological examination (general) (routine) without abnormal findings: Secondary | ICD-10-CM | POA: Insufficient documentation

## 2021-07-18 NOTE — Assessment & Plan Note (Signed)
Per chart review this is a chronic issue.  Patient ambulated in and initially had O2 saturation of approximately 80%.  After sitting for several minutes and taking deep breaths her oxygen saturation increased to 89% but on recheck at rest her O2 sat was 84%.  Discussed with the patient a pulmonary referral but was also going to discuss at home O2/medication she may need and the patient left prior to this discussion occurring.  I have attempted to call the patient multiple times and will reattempt at a later time but she has not answered previous calls.  Discussed smoking cessation.  Patient needs to be seen by pulmonology but conversation will continue at next visit.

## 2021-07-18 NOTE — Progress Notes (Signed)
    SUBJECTIVE:   CHIEF COMPLAINT / HPI:   Vaginal concerns Patient reports he got up 1 morning and felt a hard lump at her introitus and had severe pain.  She pushed it back in and the pain resolved but reports that she had problems with bowel movements after.  Respiratory concerns Patient reports she has felt short of breath for considerable amount of time.  She smokes 3 to 4 cigarettes a day.  She has never used home O2.  Per chart review the patient has history of hypoxia and has been referred to pulmonology on multiple occasions.  Is also supposed to be taking Symbicort.   OBJECTIVE:   BP 104/71   Pulse (!) 104   Ht 5' 6.5" (1.689 m)   Wt (!) 306 lb 6.4 oz (139 kg)   SpO2 (!) 89%   BMI 48.71 kg/m   General: Obese 45 year old female in no acute distress.  Of note, went in with nurse to perform pelvic exam and noticed stool at the bottom of the bed but also noted at the head of the bed Cardiac: Regular rate and rhythm Lungs: Normal work of breathing, prolonged expiratory phase, no wheezes appreciated GU: No abnormal external female genitalia, normal internal vaginal tissue, cervical os facing downwards, nonfriable cervix, bimanual exam unable to palpate uterus due to body habitus but no cervical motion tenderness    ASSESSMENT/PLAN:   Hypoxemia Per chart review this is a chronic issue.  Patient ambulated in and initially had O2 saturation of approximately 80%.  After sitting for several minutes and taking deep breaths her oxygen saturation increased to 89% but on recheck at rest her O2 sat was 84%.  Discussed with the patient a pulmonary referral but was also going to discuss at home O2/medication she may need and the patient left prior to this discussion occurring.  I have attempted to call the patient multiple times and will reattempt at a later time but she has not answered previous calls.  Discussed smoking cessation.  Patient needs to be seen by pulmonology but conversation  will continue at next visit.  Vaginal pain Patient reports an episode of vaginal pain where she sat up and noticed something hard at her introitus.  She pushed it back up and the pain resolved.  She also reports difficulties with bowel movements intermittently since then.  Physical exam was reassuring and saw no abnormalities.  Unclear etiology but could have been uterine prolapse or some form of cyst.  Did not get to complete discussion with patient regarding further management but would recommend the patient monitor for return of symptoms and if it becomes regular referral for gynecology.   Had discussion with patient regarding her hypoxemia and informed her that I wanted to check her oxygen saturation at rest and determine how much home O2 she may need using O2 nasal cannula.  When nursing staff went back to the patient's room to assess O2 saturation with nasal cannula patient had left without informing anyone.  I have attempted to call the patient's mobile phone twice as well as home phone once.  She did not respond to either calls.  Will reattempt call at a later time.  Derrel Nip, MD [facility]

## 2021-07-18 NOTE — Patient Instructions (Signed)
It was wonderful seeing you today.  Regarding her vaginal issues there are number of things that could have caused the pain that you experienced.  From my pelvic exam today I have no clear cause but it could have been that your uterus came down.  If you have the symptoms again please let me know and I will put a referral in for gynecology.  Regarding her Pap we collected that today.  Regarding your breathing issues I have have placed a referral for pulmonology.  I am also placing an order for home oxygen.  If you have any questions or concerns please let me know.  I hope you have a wonderful afternoon!

## 2021-07-18 NOTE — Assessment & Plan Note (Signed)
Patient reports an episode of vaginal pain where she sat up and noticed something hard at her introitus.  She pushed it back up and the pain resolved.  She also reports difficulties with bowel movements intermittently since then.  Physical exam was reassuring and saw no abnormalities.  Unclear etiology but could have been uterine prolapse or some form of cyst.  Did not get to complete discussion with patient regarding further management but would recommend the patient monitor for return of symptoms and if it becomes regular referral for gynecology.

## 2021-07-19 ENCOUNTER — Telehealth: Payer: Self-pay | Admitting: Family Medicine

## 2021-07-19 LAB — CYTOLOGY - PAP
Comment: NEGATIVE
Diagnosis: NEGATIVE
High risk HPV: NEGATIVE

## 2021-07-19 NOTE — Telephone Encounter (Signed)
Called the patient's cell phone today without an answer.  Patient needs to be seen again and needs to establish with pulmonology and most likely needs at home oxygen.  We will send a letter to the patient's house asking her to call to establish follow-up.

## 2021-07-24 NOTE — Telephone Encounter (Signed)
Called pt and mailed letter. If pt responds, please schedule her an appt with the first available Dr. Sunday Spillers, CMA

## 2021-07-25 ENCOUNTER — Other Ambulatory Visit: Payer: Self-pay | Admitting: Family Medicine

## 2022-01-17 ENCOUNTER — Other Ambulatory Visit: Payer: Self-pay | Admitting: Family Medicine

## 2022-07-04 ENCOUNTER — Other Ambulatory Visit: Payer: Self-pay | Admitting: Family Medicine

## 2022-08-09 ENCOUNTER — Ambulatory Visit (INDEPENDENT_AMBULATORY_CARE_PROVIDER_SITE_OTHER): Payer: Medicare Other | Admitting: Student

## 2022-08-09 VITALS — BP 92/64 | HR 103 | Ht 66.0 in | Wt 292.2 lb

## 2022-08-09 DIAGNOSIS — R102 Pelvic and perineal pain: Secondary | ICD-10-CM | POA: Diagnosis not present

## 2022-08-09 DIAGNOSIS — N898 Other specified noninflammatory disorders of vagina: Secondary | ICD-10-CM | POA: Diagnosis not present

## 2022-08-09 DIAGNOSIS — R300 Dysuria: Secondary | ICD-10-CM

## 2022-08-09 DIAGNOSIS — F250 Schizoaffective disorder, bipolar type: Secondary | ICD-10-CM

## 2022-08-09 DIAGNOSIS — Z59819 Housing instability, housed unspecified: Secondary | ICD-10-CM

## 2022-08-09 LAB — POCT URINALYSIS DIP (MANUAL ENTRY)
Bilirubin, UA: NEGATIVE
Blood, UA: NEGATIVE
Glucose, UA: NEGATIVE mg/dL
Ketones, POC UA: NEGATIVE mg/dL
Leukocytes, UA: NEGATIVE
Nitrite, UA: NEGATIVE
Spec Grav, UA: <=1.005 — AB (ref 1.010–1.025)
Urobilinogen, UA: 1 E.U./dL
pH, UA: 6.5 (ref 5.0–8.0)

## 2022-08-09 LAB — POCT WET PREP (WET MOUNT)
Clue Cells Wet Prep Whiff POC: NEGATIVE
Trichomonas Wet Prep HPF POC: ABSENT
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
## 2022-08-09 NOTE — Assessment & Plan Note (Signed)
Denies any SI/HI today.  Strange affect however are not acutely psychotic today.  No hallucinations.  Not taking her Risperdal. -Likely needs psychiatry follow-up -CCM referral
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
## 2022-08-09 NOTE — Patient Instructions (Signed)
It was great to see you! Thank you for allowing me to participate in your care!   Our plans for today:  - We will check your wet prep and UA - I have referred you to CCM who will call you  Take care and seek immediate care sooner if you develop any concerns.  Levin Erp, MD

## 2022-08-09 NOTE — Assessment & Plan Note (Addendum)
Any pain when urinating and increased urinary frequency for the past few days.  No systemic symptoms. UA not infected appearing -monitor

## 2022-08-09 NOTE — Assessment & Plan Note (Addendum)
Patient says that she has been having some vaginal pain and discharge for the past month that she feels like is like a yeast infection.  Denies any fevers/systemic symptoms. Wet prep showed many bacteria but no other issues. Patient declined STD testing -Wet prep -Declines STD testing, consider at future visit as not clear reason for bacteria

## 2022-08-10 ENCOUNTER — Telehealth: Payer: Self-pay | Admitting: *Deleted

## 2022-08-10 NOTE — Progress Notes (Unsigned)
  Care Coordination  Outreach Note  08/10/2022 Name: Tonya Gill MRN: [id number] DOB: Jun 28, 1976   Care Coordination Outreach Attempts: An unsuccessful telephone outreach was attempted today to offer the patient information about available care coordination services as a benefit of their health plan.   Follow Up Plan:  Additional outreach attempts will be made to offer the patient care coordination information and services.   Encounter Outcome:  No Answer  Christie Nottingham  Care Coordination Care Guide  Direct Dial: [phone]

## 2022-08-13 NOTE — Progress Notes (Unsigned)
  Care Coordination  Outreach Note  08/13/2022 Name: [name] MRN: [id number] DOB: Jun 22, 1976   Care Coordination Outreach Attempts: A second unsuccessful outreach was attempted today to offer the patient with information about available care coordination services as a benefit of their health plan.     Follow Up Plan:  Additional outreach attempts will be made to offer the patient care coordination information and services.   Encounter Outcome:  No Answer  Christie Nottingham  Care Coordination Care Guide  Direct Dial: [phone]

## 2022-08-14 NOTE — Progress Notes (Signed)
  Care Coordination   Note   08/14/2022 Name: MAHAM QUINTIN MRN: [id number] DOB: 21-Sep-1976  Phylliss Blakes is a 46 y.o. year old female who sees Lilland, Percival Spanish, DO for primary care. I reached out to [employer] by phone today to offer care coordination services.  Ms. Dano was given information about Care Coordination services today including:   The Care Coordination services include support from the care team which includes your Nurse Coordinator, Clinical Social Worker, or Pharmacist.  The Care Coordination team is here to help remove barriers to the health concerns and goals most important to you. Care Coordination services are voluntary, and the patient may decline or stop services at any time by request to their care team member.   Care Coordination Consent Status: Patient did not agree to participate in care coordination services at this time.    Encounter Outcome:  Pt. Refused  [facility] Coordination Care Guide  Direct Dial: [phone]

## 2022-09-11 ENCOUNTER — Ambulatory Visit: Payer: Medicare Other | Admitting: Family Medicine

## 2022-10-16 ENCOUNTER — Telehealth: Payer: Self-pay | Admitting: Family Medicine

## 2022-10-16 NOTE — Telephone Encounter (Signed)
Left message for patient to call back and schedule Medicare Annual Wellness Visit (AWV) in office.  ? ?If not able to come in office, please offer to do virtually or by telephone.  Left office number and my jabber #[phone]. ? ?Last AWV:10/01/2009 ? ?Please schedule at anytime with Nurse Health Advisor. ?  ?

## 2022-11-06 ENCOUNTER — Ambulatory Visit: Payer: 59

## 2022-11-22 ENCOUNTER — Other Ambulatory Visit: Payer: Self-pay | Admitting: Family Medicine

## 2023-02-27 IMAGING — CT CT ANGIO CHEST
2 of 7 series · 18 of 46 positions shown · IV contrast (omnipaque)
Comparison: None.

CLINICAL DATA: Hypoxia, positive D-dimer level.

EXAM:
CT ANGIOGRAPHY CHEST WITH CONTRAST
TECHNIQUE: Multidetector CT imaging of the chest was performed using the
standard protocol during bolus administration of intravenous
contrast. Multiplanar CT image reconstructions and MIPs were
obtained to evaluate the vascular anatomy.
CONTRAST:  80mL OMNIPAQUE IOHEXOL 350 MG/ML SOLN

[Series 7: thins · axial · 0.75mm/px · z∈[+1180,+1424]mm · 15 of 392 slices shown]
[im 22/392  lung]
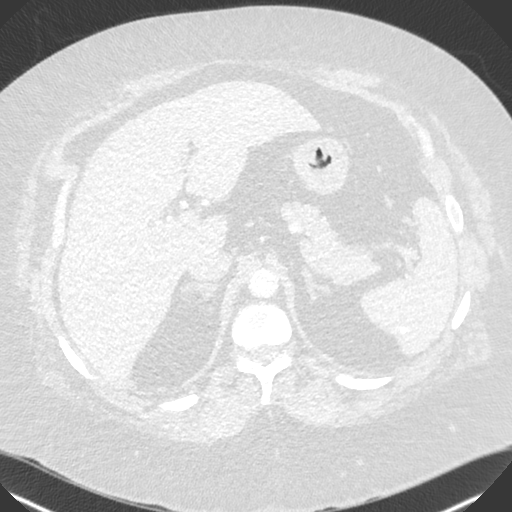
[im 44/392  soft-tissue]
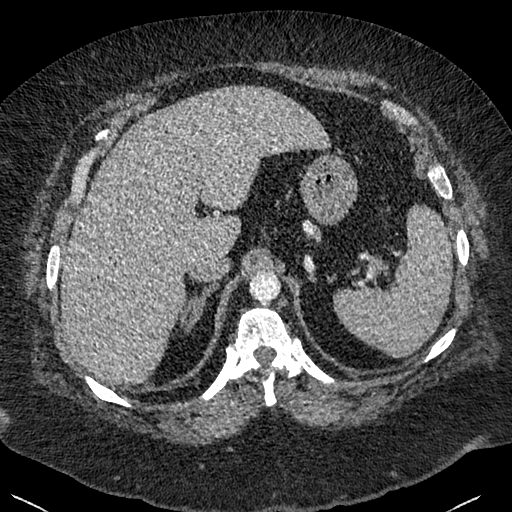
[im 66/392  lung]
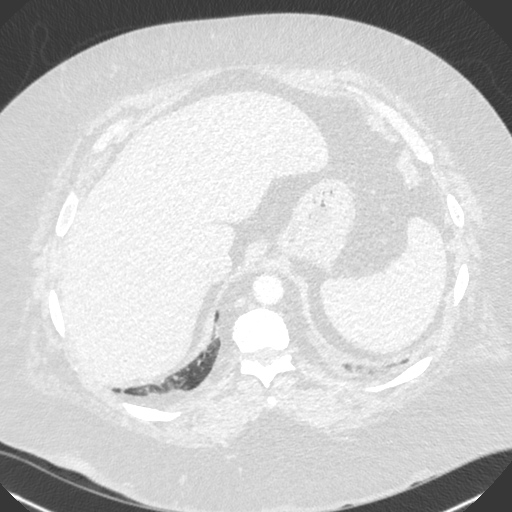
[im 87/392  soft-tissue]
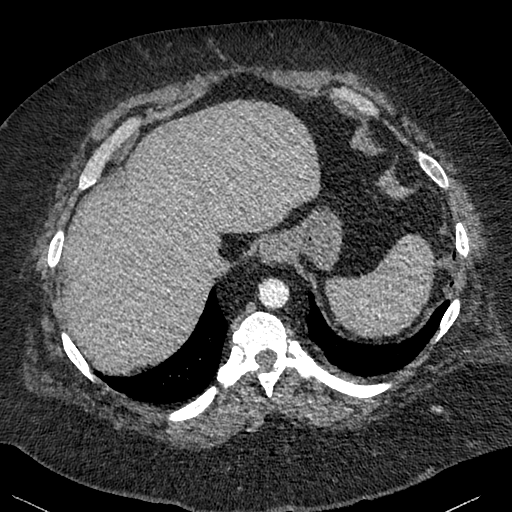
[im 131/392  lung]
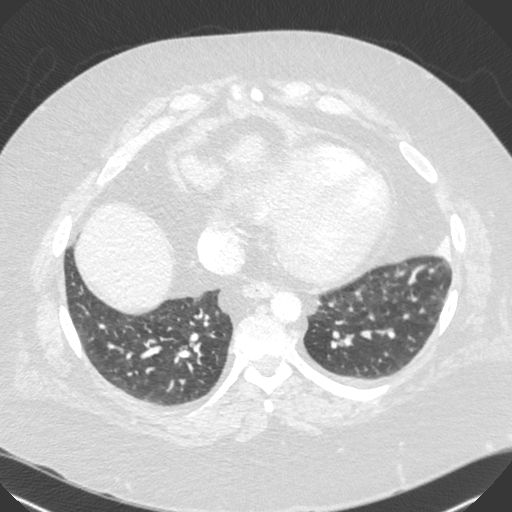
[im 153/392  soft-tissue]
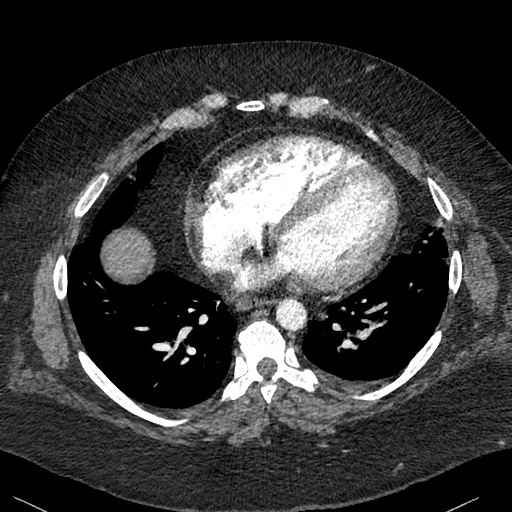
[im 174/392  lung]
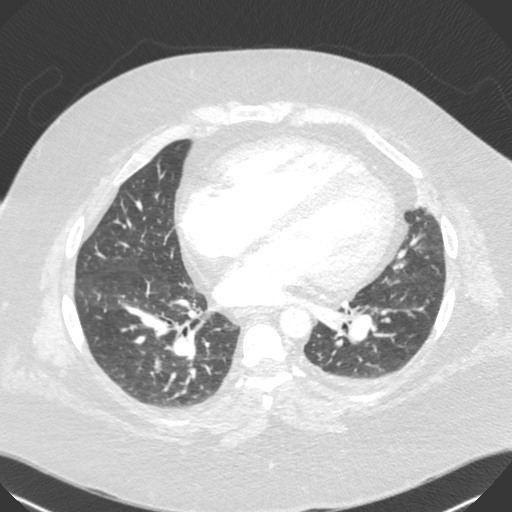
[im 196/392  soft-tissue]
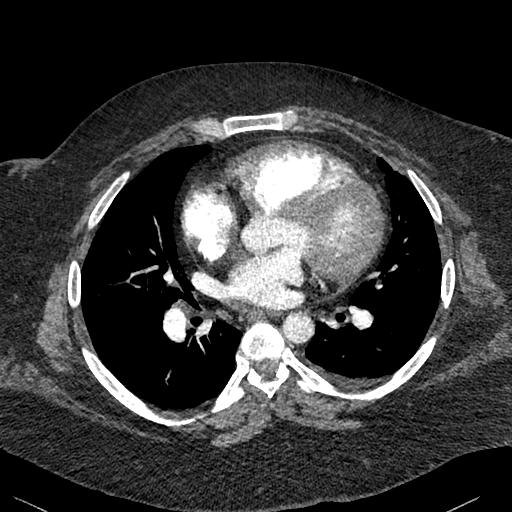
[im 218/392  lung]
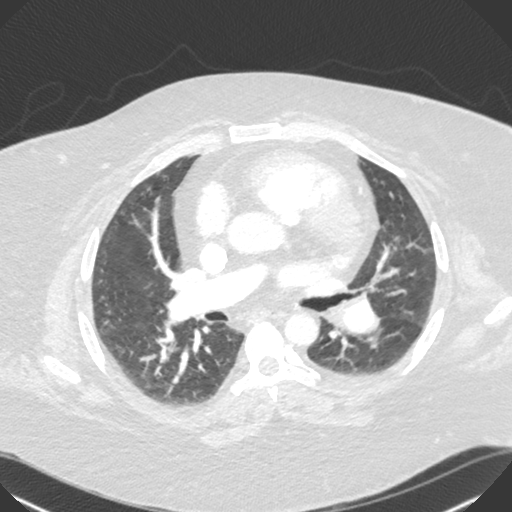
[im 239/392  soft-tissue]
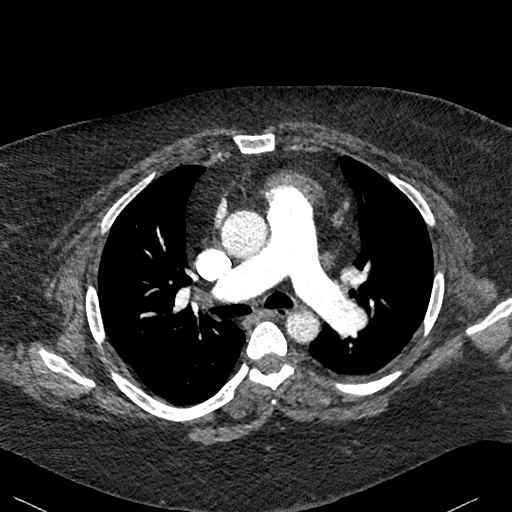
[im 261/392  lung]
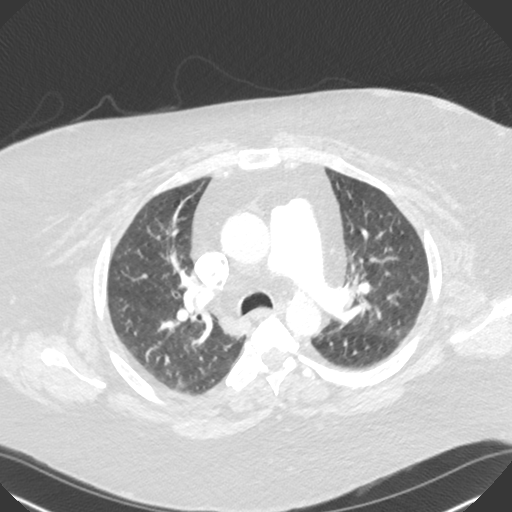
[im 305/392  soft-tissue]
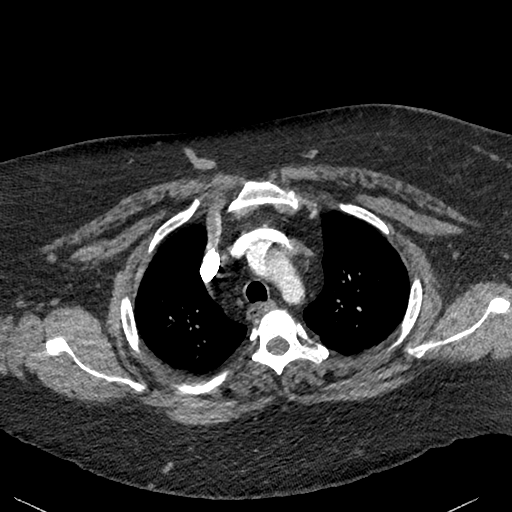
[im 326/392  lung]
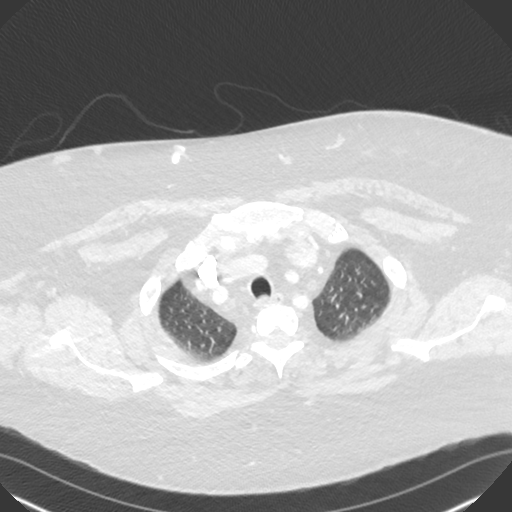
[im 348/392  soft-tissue]
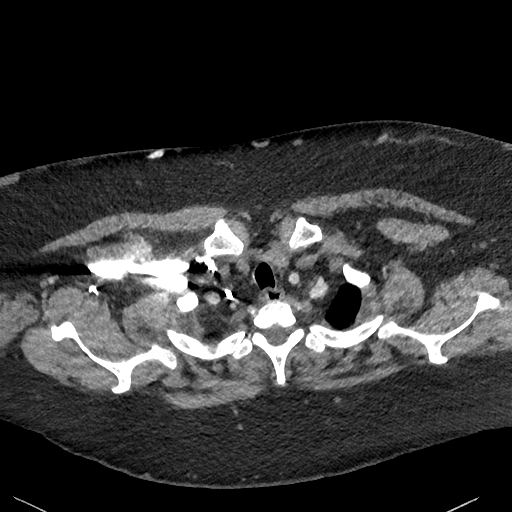
[im 370/392  lung]
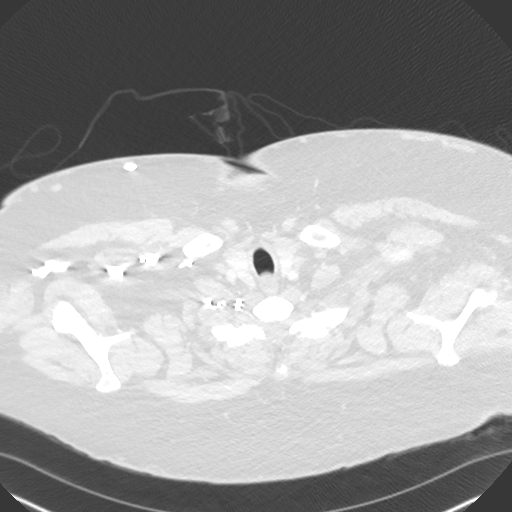

[Series 8: cor · coronal · 0.61mm/px · 3 of 140 slices shown]
[im 35/140  soft-tissue]
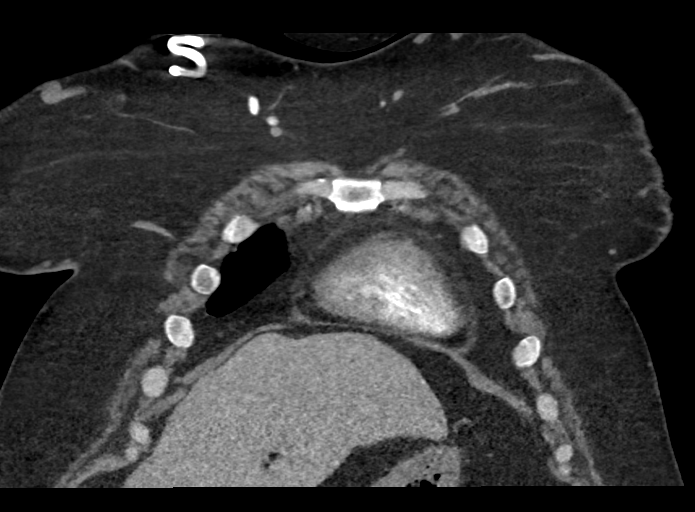
[im 70/140  soft-tissue]
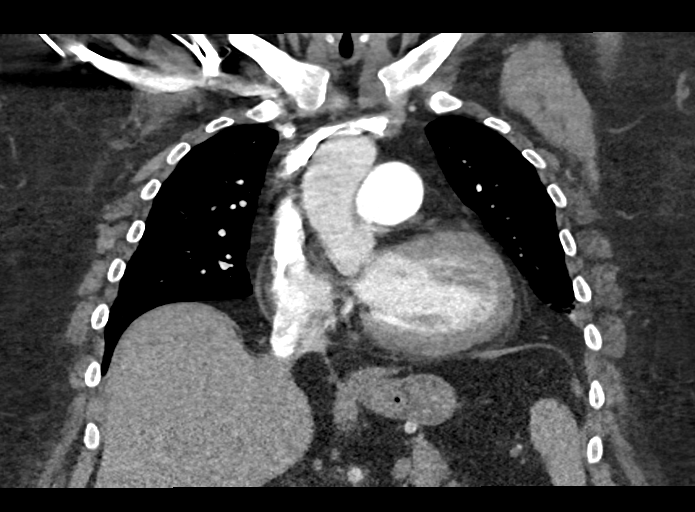
[im 105/140  soft-tissue]
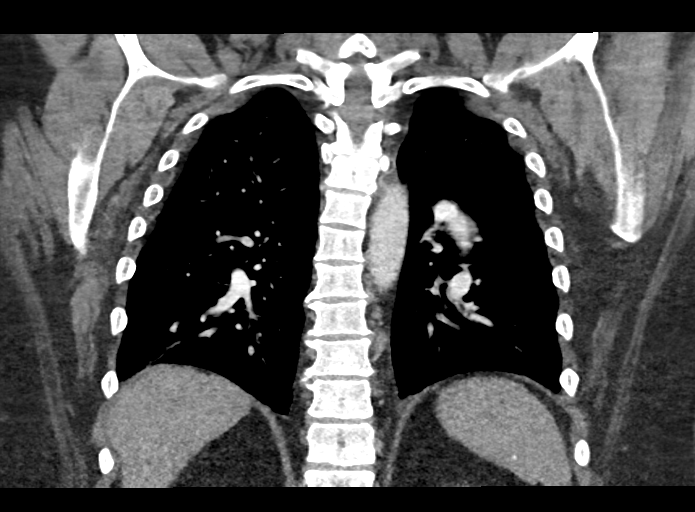

[18 of 46 positions shown; findings below may reference images not displayed]

FINDINGS: Cardiovascular: Satisfactory opacification of the pulmonary arteries
to the segmental level. No evidence of pulmonary embolism. Mild
cardiomegaly is noted. No pericardial effusion.

Mediastinum/Nodes: No enlarged mediastinal, hilar, or axillary lymph
nodes. Thyroid gland, trachea, and esophagus demonstrate no
significant findings.

Lungs/Pleura: Lungs are clear. No pleural effusion or pneumothorax.

Upper Abdomen: No acute abnormality.

Musculoskeletal: No chest wall abnormality. No acute or significant
osseous findings.

Review of the MIP images confirms the above findings.
IMPRESSION: 1. No definite evidence of pulmonary embolus.
2. Mild cardiomegaly.
3. No other abnormality seen in the chest.

## 2024-03-03 ENCOUNTER — Telehealth: Payer: Self-pay | Admitting: Student

## 2024-03-03 NOTE — Telephone Encounter (Signed)
 Patient has a "Annual Care Visit" gap per [facility]. Called patient to schedule an annual physical with their PCP.

## 2024-07-20 NOTE — Progress Notes (Signed)
 Tonya Gill                                          MRN: [id number]   07/20/2024   The [facility] Quality Team Specialist reviewed this patient medical record for the purposes of chart review for care gap closure. The following were reviewed: chart review for care gap closure-breast cancer screening and colorectal cancer screening.    [facility] Quality Team

## 2024-07-30 ENCOUNTER — Telehealth: Payer: Self-pay

## 2024-07-30 NOTE — Telephone Encounter (Signed)
 Called patient to get her set up for a follow up with [facility]. No answer, no machine.
